# Patient Record
Sex: Female | Born: 1970 | Hispanic: Yes | Marital: Married | Smoking: Never smoker
Health system: Southern US, Community
[De-identification: ages and names within clinical notes are randomized; demographics above are authoritative.]

## PROBLEM LIST (undated history)

## (undated) DIAGNOSIS — D649 Anemia, unspecified: Secondary | ICD-10-CM

## (undated) DIAGNOSIS — N938 Other specified abnormal uterine and vaginal bleeding: Secondary | ICD-10-CM

## (undated) HISTORY — PX: NO PAST SURGERIES: SHX2092

## (undated) HISTORY — DX: Other specified abnormal uterine and vaginal bleeding: N93.8

---

## 1999-02-07 ENCOUNTER — Ambulatory Visit (HOSPITAL_COMMUNITY): Admission: RE | Admit: 1999-02-07 | Discharge: 1999-02-07 | Payer: Self-pay | Admitting: Obstetrics

## 1999-03-25 ENCOUNTER — Ambulatory Visit (HOSPITAL_COMMUNITY): Admission: RE | Admit: 1999-03-25 | Discharge: 1999-03-25 | Payer: Self-pay | Admitting: *Deleted

## 1999-08-22 ENCOUNTER — Inpatient Hospital Stay (HOSPITAL_COMMUNITY): Admission: AD | Admit: 1999-08-22 | Discharge: 1999-08-23 | Payer: Self-pay | Admitting: *Deleted

## 2007-06-08 ENCOUNTER — Ambulatory Visit: Payer: Self-pay | Admitting: *Deleted

## 2007-06-08 ENCOUNTER — Ambulatory Visit: Payer: Self-pay | Admitting: Family Medicine

## 2007-06-28 ENCOUNTER — Ambulatory Visit: Payer: Self-pay | Admitting: Internal Medicine

## 2007-06-28 ENCOUNTER — Encounter: Payer: Self-pay | Admitting: Family Medicine

## 2007-06-28 LAB — CONVERTED CEMR LAB
ALT: 20 units/L (ref 0–35)
AST: 19 units/L (ref 0–37)
Albumin: 4.4 g/dL (ref 3.5–5.2)
Alkaline Phosphatase: 60 units/L (ref 39–117)
BUN: 12 mg/dL (ref 6–23)
Basophils Absolute: 0 10*3/uL (ref 0.0–0.1)
Basophils Relative: 1 % (ref 0–1)
CO2: 23 meq/L (ref 19–32)
Calcium: 9.1 mg/dL (ref 8.4–10.5)
Chloride: 108 meq/L (ref 96–112)
Cholesterol: 148 mg/dL (ref 0–200)
Creatinine, Ser: 0.56 mg/dL (ref 0.40–1.20)
Eosinophils Absolute: 0.1 10*3/uL (ref 0.0–0.7)
Eosinophils Relative: 2 % (ref 0–5)
Glucose, Bld: 95 mg/dL (ref 70–99)
HCT: 41.4 % (ref 36.0–46.0)
HDL: 58 mg/dL (ref >39)
Hemoglobin: 13.1 g/dL (ref 12.0–15.0)
LDL Cholesterol: 82 mg/dL (ref 0–99)
Lymphocytes Relative: 38 % (ref 12–46)
Lymphs Abs: 2 10*3/uL (ref 0.7–4.0)
MCHC: 31.6 g/dL (ref 30.0–36.0)
MCV: 97.6 fL (ref 78.0–100.0)
Monocytes Absolute: 0.3 10*3/uL (ref 0.1–1.0)
Monocytes Relative: 6 % (ref 3–12)
Neutro Abs: 2.8 10*3/uL (ref 1.7–7.7)
Neutrophils Relative %: 53 % (ref 43–77)
Platelets: 253 10*3/uL (ref 150–400)
Potassium: 4.3 meq/L (ref 3.5–5.3)
RBC: 4.24 M/uL (ref 3.87–5.11)
RDW: 13.3 % (ref 11.5–15.5)
Sodium: 141 meq/L (ref 135–145)
TSH: 1.243 microintl units/mL (ref 0.350–5.50)
Total Bilirubin: 0.4 mg/dL (ref 0.3–1.2)
Total CHOL/HDL Ratio: 2.6
Total Protein: 7.5 g/dL (ref 6.0–8.3)
Triglycerides: 39 mg/dL (ref <150)
VLDL: 8 mg/dL (ref 0–40)
WBC: 5.3 10*3/uL (ref 4.0–10.5)

## 2007-07-13 ENCOUNTER — Ambulatory Visit: Payer: Self-pay | Admitting: Internal Medicine

## 2008-12-18 ENCOUNTER — Inpatient Hospital Stay (HOSPITAL_COMMUNITY): Admission: AD | Admit: 2008-12-18 | Discharge: 2008-12-20 | Payer: Self-pay | Admitting: Obstetrics

## 2010-03-03 ENCOUNTER — Encounter: Payer: Self-pay | Admitting: Obstetrics

## 2010-05-14 LAB — CBC
HCT: 28.8 % — ABNORMAL LOW (ref 36.0–46.0)
HCT: 38.9 % (ref 36.0–46.0)
Hemoglobin: 13.3 g/dL (ref 12.0–15.0)
Hemoglobin: 9.8 g/dL — ABNORMAL LOW (ref 12.0–15.0)
MCHC: 34 g/dL (ref 30.0–36.0)
MCHC: 34.1 g/dL (ref 30.0–36.0)
MCV: 96.7 fL (ref 78.0–100.0)
MCV: 98.1 fL (ref 78.0–100.0)
Platelets: 179 10*3/uL (ref 150–400)
Platelets: 219 10*3/uL (ref 150–400)
RBC: 2.93 MIL/uL — ABNORMAL LOW (ref 3.87–5.11)
RBC: 4.02 MIL/uL (ref 3.87–5.11)
RDW: 13.1 % (ref 11.5–15.5)
RDW: 13.3 % (ref 11.5–15.5)
WBC: 14.3 10*3/uL — ABNORMAL HIGH (ref 4.0–10.5)
WBC: 8.6 10*3/uL (ref 4.0–10.5)

## 2010-05-14 LAB — RPR: RPR Ser Ql: NONREACTIVE

## 2012-04-14 ENCOUNTER — Other Ambulatory Visit: Payer: Self-pay | Admitting: Obstetrics and Gynecology

## 2012-04-14 ENCOUNTER — Encounter: Payer: Self-pay | Admitting: Obstetrics & Gynecology

## 2012-04-14 DIAGNOSIS — Z1231 Encounter for screening mammogram for malignant neoplasm of breast: Secondary | ICD-10-CM

## 2012-04-19 ENCOUNTER — Encounter: Payer: Self-pay | Admitting: *Deleted

## 2012-04-27 ENCOUNTER — Encounter (HOSPITAL_COMMUNITY): Payer: Self-pay | Admitting: *Deleted

## 2012-05-03 ENCOUNTER — Encounter (HOSPITAL_COMMUNITY): Payer: Self-pay

## 2012-05-03 ENCOUNTER — Ambulatory Visit (HOSPITAL_COMMUNITY)
Admission: RE | Admit: 2012-05-03 | Discharge: 2012-05-03 | Disposition: A | Discharge status: Home / Self Care | Payer: Self-pay | Source: Ambulatory Visit | Attending: Obstetrics and Gynecology | Admitting: Obstetrics and Gynecology

## 2012-05-03 VITALS — BP 110/68 | Temp 98.7°F | Ht 65.5 in | Wt 156.0 lb

## 2012-05-03 DIAGNOSIS — Z1239 Encounter for other screening for malignant neoplasm of breast: Secondary | ICD-10-CM

## 2012-05-03 DIAGNOSIS — Z1231 Encounter for screening mammogram for malignant neoplasm of breast: Secondary | ICD-10-CM

## 2012-05-03 HISTORY — DX: Anemia, unspecified: D64.9

## 2012-05-03 NOTE — Patient Instructions (Signed)
Taught patient how to perform BSE and gave educational materials to take home. Patient did not need a Pap smear today due to last Pap smear was 03/14/2012. Let her know BCCCP will cover Pap smears every 3 years unless has a history of abnormal Pap smears. Let patient know will follow up with her within the next couple weeks with results by letter or phone. Patient verbalized understanding. Patient escorted to mammography for a screening.

## 2012-05-03 NOTE — Progress Notes (Signed)
No complaints today.  Pap Smear:  Pap smear not completed today. Last Pap smear was 03/14/2012 at the free cervical cancer screening at the [facility] and normal. Per patient no history of abnormal Pap smears. Pap smear result is scanned in EPIC under media.  Physical exam: Breasts Breasts symmetrical. No skin abnormalities bilateral breasts. No nipple retraction bilateral breasts. No nipple discharge bilateral breasts. No lymphadenopathy. No lumps palpated bilateral breasts. No complaints of pain or tenderness on exam. Patient escorted to mammography for a screening mammogram.        Pelvic/Bimanual No Pap smear completed today since last Pap smear was 03/14/2012. Pap smear not indicated per BCCCP guidelines.

## 2012-05-05 ENCOUNTER — Other Ambulatory Visit: Payer: Self-pay | Admitting: Obstetrics and Gynecology

## 2012-05-05 DIAGNOSIS — R928 Other abnormal and inconclusive findings on diagnostic imaging of breast: Secondary | ICD-10-CM

## 2012-05-09 ENCOUNTER — Other Ambulatory Visit (HOSPITAL_COMMUNITY)
Admission: RE | Admit: 2012-05-09 | Discharge: 2012-05-09 | Disposition: A | Discharge status: Home / Self Care | Payer: Self-pay | Source: Ambulatory Visit | Attending: Obstetrics & Gynecology | Admitting: Obstetrics & Gynecology

## 2012-05-09 ENCOUNTER — Ambulatory Visit (INDEPENDENT_AMBULATORY_CARE_PROVIDER_SITE_OTHER): Payer: Self-pay | Admitting: Obstetrics & Gynecology

## 2012-05-09 ENCOUNTER — Encounter: Payer: Self-pay | Admitting: Obstetrics & Gynecology

## 2012-05-09 VITALS — BP 104/66 | HR 67 | Temp 98.6°F | Wt 153.0 lb

## 2012-05-09 DIAGNOSIS — N920 Excessive and frequent menstruation with regular cycle: Secondary | ICD-10-CM | POA: Insufficient documentation

## 2012-05-09 LAB — POCT PREGNANCY, URINE: Preg Test, Ur: NEGATIVE

## 2012-05-09 LAB — TSH: TSH: 2.936 u[IU]/mL (ref 0.350–4.500)

## 2012-05-09 LAB — CBC
MCH: 23.6 pg — ABNORMAL LOW (ref 26.0–34.0)
Platelets: 271 10*3/uL (ref 150–400)
RBC: 3.48 MIL/uL — ABNORMAL LOW (ref 3.87–5.11)
RDW: 17.3 % — ABNORMAL HIGH (ref 11.5–15.5)
WBC: 3.6 10*3/uL — ABNORMAL LOW (ref 4.0–10.5)

## 2012-05-09 MED ORDER — NORGESTIMATE-ETH ESTRADIOL 0.25-35 MG-MCG PO TABS: 1.0000 | ORAL_TABLET | Freq: Every day | ORAL | Status: DC

## 2012-05-09 NOTE — Progress Notes (Signed)
Subjective:     Patient ID: Jill Meza, female   DOB: 14-Dec-1970, 42 y.o.   MRN: [id number]  HPI Pt is a 42yo [id number] who presents with c/o heavy bleeding.  She reports cycles that come q 28 days.  Reports bleeding between cycles and bleeding after intercourse.  She is sexually active with 1 partner.  She is married.  Her cycles are very heavy with clots and between cycles there is a mucous discharge that is brown.  She reports to being anemic and weak due to her loss of blood  Past Medical History  Diagnosis Date  . Anemia   No past surgical history on file. Current Outpatient Prescriptions on File Prior to Visit  Medication Sig Dispense Refill  . ferrous fumarate (HEMOCYTE - 106 MG FE) 325 (106 FE) MG TABS Take 1 tablet by mouth 2 (two) times daily.       No current facility-administered medications on file prior to visit.   History   Social History  . Marital Status: Single    Spouse Name: N/A    Number of Children: N/A  . Years of Education: N/A   Occupational History  . Not on file.   Social History Main Topics  . Smoking status: Never Smoker   . Smokeless tobacco: Never Used  . Alcohol Use: No  . Drug Use: No  . Sexually Active: Yes    Birth Control/ Protection: Other-see comments     Comment: vasecotomy   Other Topics Concern  . Not on file   Social History Narrative  . No narrative on file           Review of Systems     Objective:   Physical Exam BP 104/66  Pulse 67  Temp(Src) 98.6 F (37 C) (Oral)  Wt 153 lb (69.4 kg)  BMI 25.06 kg/m2  LMP 04/25/2012   The indications for endometrial biopsy were reviewed.   Risks of the biopsy including cramping, bleeding, infection, uterine perforation, inadequate specimen and need for additional procedures  were discussed. The patient states she understands and agrees to undergo procedure today. Consent was signed. Time out was performed. Urine HCG was negative. A sterile speculum was placed in the  patient's vagina and the cervix was prepped with Betadine. A single-toothed tenaculum was placed on the anterior lip of the cervix to stabilize it. The 3 mm pipelle was introduced into the endometrial cavity without difficulty to a depth of 13cm, and a moderate amount of tissue was obtained and sent to pathology. The instruments were removed from the patient's vagina. Minimal bleeding from the cervix was noted. The patient tolerated the procedure well.     Assessment:     Menorrhagia- need to eval etiology     Plan:     Labs: cbc; TSH Routine post-procedure instructions were given to the patient. The patient will follow up to review the results and for further management.   Sprintec 1 po q day F/u 2 weeks Pelvic sono  Spanish translator used for the history and physical exam

## 2012-05-09 NOTE — Patient Instructions (Addendum)
Menorragia (Menorrhagia) La hemorragia uterina (sangrado del tero) disfuncional es diferente del perodo menstrual normal. Cuando los perodos son irregulares o hay ms hemorragia que lo habitual en usted, se denomina menorragia. La causa puede ser un desequilibrio hormonal, fsico o metablico u otros problemas. Es necesario realizar un examen para que el profesional pueda tratar controlar las causas que son tratables. Si el problema contina, ser necesario realizar una dilatacin y curetaje. Este procedimiento consiste en dilatar el cuello del tero (la apertura del tero o matriz), es decir, se lo estira para agrandarlo, y se raspa la superficie que tapiza el interior del tero. Un especialista (patlogo) examina en el microscopio el tejido que se retira para asegurarse que no haya nada preocupante que requiera un examen ms exhaustivo. INSTRUCCIONES PARA EL CUIDADO DOMICILIARIO  Si el profesional que la asiste le prescribe medicamentos, tmelos tal como se le indic. No cambie ni reemplace medicamentos sin consultarlo con el profesional.  Las hemorragias de larga duracin pueden tener como consecuencia un dficit de hierro. El profesional que la asiste podr prescribirle comprimidos de hierro. Esto ayuda a reponer el hierro que el organismo pierde luego de una hemorragia abundante. Tome los medicamentos tal como se le indic. El hierro puede causarle estreimiento. Si esto es un problema, aumente el consumo de salvado, frutas y fibra.  No tome aspirina o medicamentos que la contengan desde una semana antes del perodo menstrual ni durante el mismo. La aspirina puede hacer que la hemorragia empeore.  Si necesita cambiar el apsito o el tampn mas de una vez cada 2 horas, permanezca en cama y descanse todo lo posible hasta que la hemorragia se detenga.  Consuma alimentos balanceados. Coma alimentos ricos en hierro. Por ejemplo, verduras de hoja verde, carne, hgado, huevos y panes y cereales de  grano integral. No trate de perder peso hasta que la hemorragia anormal se detenga y los niveles de hierro en la sangre vuelvan a la normalidad. SOLICITE ATENCIN MDICA SI:  Debe cambiar el apsito o el tampn ms de una vez cada hora.  Si tiene nuseas (ganas de vomitar), vmitos, mareos o diarrea mientras toma los medicamentos.  Tiene algn problema que pueda relacionarse con el medicamento que est tomando. SOLICITE ATENCIN MDICA DE INMEDIATO SI:  Tiene fiebre.  Siente escalofros.  Sufre una hemorragia intensa o comienza a eliminar cogulos de sangre.  Se siente mareado o sufre un desmayo. EST SEGURO QUE:   Comprende las instrucciones para el alta mdica.  Controlar su enfermedad.  Solicitar atencin mdica de inmediato segn las indicaciones. Document Released: 11/05/2004 Document Revised: 04/20/2011 Exit[employer] Patient Information 2013 [employer].  

## 2012-05-12 ENCOUNTER — Ambulatory Visit (HOSPITAL_COMMUNITY)
Admission: RE | Admit: 2012-05-12 | Discharge: 2012-05-12 | Disposition: A | Discharge status: Home / Self Care | Payer: Self-pay | Source: Ambulatory Visit | Attending: Obstetrics & Gynecology | Admitting: Obstetrics & Gynecology

## 2012-05-12 ENCOUNTER — Encounter: Payer: Self-pay | Admitting: *Deleted

## 2012-05-12 DIAGNOSIS — N938 Other specified abnormal uterine and vaginal bleeding: Secondary | ICD-10-CM | POA: Insufficient documentation

## 2012-05-12 DIAGNOSIS — N949 Unspecified condition associated with female genital organs and menstrual cycle: Secondary | ICD-10-CM | POA: Insufficient documentation

## 2012-05-12 DIAGNOSIS — N920 Excessive and frequent menstruation with regular cycle: Secondary | ICD-10-CM

## 2012-05-12 DIAGNOSIS — D259 Leiomyoma of uterus, unspecified: Secondary | ICD-10-CM | POA: Insufficient documentation

## 2012-05-17 ENCOUNTER — Ambulatory Visit
Admission: RE | Admit: 2012-05-17 | Discharge: 2012-05-17 | Disposition: A | Discharge status: Home / Self Care | Payer: No Typology Code available for payment source | Source: Ambulatory Visit | Attending: Obstetrics and Gynecology | Admitting: Obstetrics and Gynecology

## 2012-05-17 DIAGNOSIS — R928 Other abnormal and inconclusive findings on diagnostic imaging of breast: Secondary | ICD-10-CM

## 2012-05-25 ENCOUNTER — Ambulatory Visit: Payer: Self-pay | Admitting: Obstetrics & Gynecology

## 2012-06-13 ENCOUNTER — Encounter: Payer: Self-pay | Admitting: Obstetrics & Gynecology

## 2012-06-13 ENCOUNTER — Ambulatory Visit (INDEPENDENT_AMBULATORY_CARE_PROVIDER_SITE_OTHER): Payer: Self-pay | Admitting: Obstetrics & Gynecology

## 2012-06-13 VITALS — BP 111/68 | HR 72 | Temp 99.0°F | Ht 64.0 in | Wt 154.8 lb

## 2012-06-13 DIAGNOSIS — N920 Excessive and frequent menstruation with regular cycle: Secondary | ICD-10-CM

## 2012-06-13 DIAGNOSIS — D500 Iron deficiency anemia secondary to blood loss (chronic): Secondary | ICD-10-CM

## 2012-06-13 MED ORDER — NORGESTIMATE-ETH ESTRADIOL 0.25-35 MG-MCG PO TABS: 1.0000 | ORAL_TABLET | Freq: Every day | ORAL | Status: DC

## 2012-06-13 MED ORDER — INTEGRA F 125-1 MG PO CAPS: 1.0000 | ORAL_CAPSULE | Freq: Every day | ORAL | Status: DC

## 2012-06-13 NOTE — Progress Notes (Signed)
Patient states she has not taken any of the birth control pills- does not know why not

## 2012-06-13 NOTE — Patient Instructions (Addendum)
Fibromas (Fibroids) Los fibromas son bultos (tumores) que pueden Conservation officer, nature del cuerpo de Nurse, mental health. Estos tumores no son cancerosos. Pueden variar en tamao, peso y lugar en el que crecen. CUIDADOS EN EL HOGAR  No tome aspirina.  Anote el nmero de apsitos o tampones que [place] durante el perodo. Infrmelo a su mdico. Esto puede ayudar a determinar el mejor tratamiento para usted. SOLICITE AYUDA DE INMEDIATO SI:  Siente dolor en la zona inferior del vientre (abdomen) y no se alivia con analgsicos.  Tiene clicos que no se calman con medicamentos  Aumenta el sangrado entre perodos o durante el mismo.  Sufre mareos o se desvanece (se desmaya).  El dolor en el vientre [place]. ASEGRESE DE QUE:  Comprende estas instrucciones.  Controlar su enfermedad.  Solicitar ayuda de inmediato si no mejora o empeora. Document Released: 05/13/2010 Document Revised: 04/20/2011 [facility] Patient Information 2013 [place], [place]. Fibroma uterino  (Uterine Fibroid)  Un fibroma uterino es Facilities manager (tumor) dentro del tero de Amanda. Este tipo de tumor no es Insurance risk surveyor y no se extiende fuera del tero. Una mujer puede tener uno o varios fibromas y algunos pueden ser bastante grandes. Un fibroma puede variar en tamao, peso y Immunologist en que se desarrolla dentro del tero. La mayora de los fibromas no necesitan tratamiento mdico, pero algunos pueden causar dolor o sangrado abundante durante los perodos y [place]. CAUSAS  Un fibroma es el resultado del desarrollo continuo de una nica clula uterina que sigue creciendo (no regulada) que es diferente al resto de las clulas del cuerpo humano. La mayora de las clulas tiene un mecanismo de control que evita que se reproduzcan de Psychologist, occupational.  SNTOMAS   Sangrado.  Dolor y sensacin de presin en la pelvis.  Problemas en la vejiga debido al tamao del fibroma.  Infertilidad y abortos espontneos, segn el  tamao y la ubicacin del fibroma. DIAGNSTICO  El diagnstico se hace por examen fsico. El mdico puede palpar los tumores abultados al realizar el examen de la pelvis. Una ecografa puede brindar informacin adicional importante acerca del tamao, la ubicacin y el nmero de tumores. Es raro que sea Passenger transport manager otras pruebas, como tomografa computada o Health visitor.  TRATAMIENTO   Su mdico puede considerar que es conveniente esperar y Pharmacologist. Esto incluye el control del fibroma por parte del mdico para observar si crece o disminuye su tamao.   Podr recomendarle un tratamiento hormonal o el uso de un dispositivo intrauterino (DIU).   En algunos casos es necesaria la ciruga para extirpar el fibroma (miomectoma) o el tero (histerectoma). Esto depender de su situacin. Cuando una mujer desea quedar embarazada y los fibromas interfieren en su fertilidad, el mdico puede recomendar la extirpacin del fibroma.  INSTRUCCIONES PARA EL CUIDADO EN EL HOGAR  Los cuidados en el hogar dependen del tratamiento que haya recibido. En general:   Concurra puntualmente a las citas de control con el mdico.   Slo tome los medicamentos que le indic su mdico. No tome aspirina. Puede ocasionar hemorragias.   Si tiene perodos muy abundantes y debe cambiar un tampn o una toalla higinica en media hora o menos, comunquese con su mdico inmediatamente. Si sus perodos son molestos pero no tan abundantes, acustese con los pies ligeramente elevados por encima del nivel del corazn. Coloque compresas fras en la zona inferior del abdomen.   Si sus perodos son muy abundantes  , anote el nmero de compresas o  tampones que [place] cada mes. Lleve esta informacin cuando visite a su mdico.   Consulte al mdico si debe tomar pldoras de hierro.   Incluya vegetales en su dieta.   Si le recetaron un tratamiento hormonal, tome los medicamentos hormonales como le indicaron.    Si le indicaron la ciruga, pdale informacin especfica a su mdico.  SOLICITE ATENCIN MDICA DE INMEDIATO SI:   Siente dolor o clicos en la pelvis y no puede controlarlos con los medicamentos.   El dolor en la pelvis aumenta de manera repentina.   Aumenta el sangrado entre los perodos o [employer].   Se siente mareado o tiene episodios de [place].  ASEGRESE DE QUE:   Comprende estas instrucciones.  Controlar su enfermedad.  Solicitar ayuda de inmediato si no mejora o si empeora. Document Released: 01/26/2005 Document Revised: 04/20/2011 [facility] Patient Information 2013 [place], [place]. Miomectoma (Myomectomy) El mioma es un tumor no canceroso que se desarrolla en el tejido fibroso. La miomectoma es la remocin de un fibroma sin extirpar otro rgano como el tero o el ovario. Estos tumores varan en tamao desde un guisante hasta un pomelo. Rara vez son cancerosos. El tratamiento slo se realiza cuando crece o cuando causa sntomas como: dolor, presin, hemorragia y Engineer, mining en las [employer]. INFORME AL PROFESIONAL QUE LO ASISTE SOBRE LOS SIGUIENTES PUNTOS:  Si tiene alergias, especialmente a algunos medicamentos.  Si se resfra o tiene alguna infeccin.  Medicamentos que toma, incluyendo hierbas, gotas oftlmicas, medicamentos de [place] y cremas  Si [place] corticoides, an en forma de cremas.  Problemas anteriores debido a anestsicos o a Community education officer.  Antecedentes de hemorragias o problemas de coagulacin  Otros problemas mdicos (diabetes, insuficiencia cardaca, pulmonar o renal).  Cirugas previas. RIESGOS Y COMPLICACIONES  Sangrado excesivo  Infeccin.  Lesin en los rganos circundantes.  Cogulos sanguneos en las piernas, el trax y el cerebro.  Desarrollo de tejido cicatrizal (adherencias) en otros rganos y en la pelvis.  Muerte durante o despus de la [place]. ANTES DE LA CIRUGA  Siga las  indicaciones del mdico relacionadas con la ciruga y la preparacin para la misma.  Evite tomar aspirina o anticoagulantes, o segn las indicaciones del mdico.  NO coma ni beba nada despus de la medianoche anterior a la ciruga o segn lo que le indiquen.  NO fume durante las [address] previas a la [place].  NO beba alcohol el da anterior a la [place].Raechel Chute al hospital el mismo da, trate de estar una hora antes de la hora programada en la oficina de admisiones.  Haga que alguna persona la lleve [employer] hospital.  Pdale a algn familiar o amigo que se quede con usted y la ayude cuando regrese a su casa. PROCEDIMIENTO Hay diferentes modos de realizar [employer].  Miomectoma histeroscpica  Se inserta un tubo dentro del tero. Con el tubo se extirpa el tumor fibroide. This is used when the fibroid is inside the cavity of the uterus. ste mtodo se utiliza cuando el fibroma se encuentra dentro de la cavidad del tero.  Miomectoma laparoscpica  Se utiliza un tubo largo con iluminacin que se inserta a travs de 2  3 incisiones pequeas para observar los rganos de la pelvis. Se utiliza para extirpar el fibroma pediculado y en algunos casos el fibroma subseroso.  La miomectoma a travs de una incisin abdominal: El fibroma se extirpa a travs de una incisin abdominal, cuando no puede extirparse por  medio de una histeroscopa o laparoscopa. LUEGO DE LA CIRUGA  Si le practicaron una miomectoma laparoscpica o histeroscpica, podr volver a su casa el mismo da o quedarse una noche en el hospital.  Si le realizaron una miomectoma abdominal, deber Engineer, maintenance hospital [employer].  Le retirarn el catter y la va intravenosa en 1  2 das.  Si permanece en el hospital, el profesional le indicar analgsicos y pldoras para dormir, si es necesario.  En caso de necesitarlo, le indicarn antibiticos.  Antes de recibir el alta, le darn indicaciones por  escrito y medicamentos. Document Released: 07/15/2007 Document Revised: 04/20/2011 [facility] Patient Information 2013 [place], [place]. Informacin sobre la histerectoma  (Hysterectomy Information)  Neomia Dear histerectoma es un procedimiento en el que se extirpa quirrgicamente el tero. Ya no tendr perodos menstruales ni quedar embarazada. Tambin durante esta ciruga podrn extirparle las trompas y los ovarios (salpingo-ooforectoma bilateral).  RAZONES PARA REALIZAR UNA HISTERECTOMA   Sangrado persistente, anormal.  Infeccin o dolor plvico de larga duracin (crnico).  El revestimiento del tero (endometrio) comienza a desarrollarse fuera del tero (endometriosis).  El endometrio comienza a desarrollarse en el msculo del tero (adenomiosis).  El tero desciende hacia la vagina (prolapso de los rganos plvicos).  Fibromas uterinos sintomticos.  Clulas precancerosas.  Cncer cervical o cncer uterino. TIPOS DE HISTERECTOMA   Histerectoma supracervical. Este tipo de ciruga elimina la parte superior del tero, pero no el cuello del tero.  Histerectoma total. Se extirpa el tero y el cuello uterino.  Histerectoma radical. Se extrae el tero, el cuello uterino y el tejido fibroso que sostiene el tero en su lugar en la pelvis (parametrio). FORMAS EN QUE SE PUEDE REALIZAR UNA HISTERECTOMA   Histerectoma abdominal. Se realiza gran corte quirrgico (incisin) en el abdomen. Se extirpa el tero a travs de esta incisin.  Histerectoma vaginal. Se hace una incisin en la vagina. Se extirpa el tero a travs de esta incisin. No hay incisiones abdominales.  Histerectoma laparoscpica convencional. Se inserta un tubo delgado que emite luz y que posee una cmara (laparoscopio) en 3 o 4 incisiones pequeas en el abdomen. El tero se corta en trozos pequeos. Las piezas pequeas se eliminan a travs de las incisiones, o se retiran a travs de la vagina.  Histerectoma  laparoscpica vaginal asistida. Se hacen tres o cuatro incisiones pequeas en el abdomen. Parte de la ciruga se realiza por va laparoscpica y parte por va vaginal. El tero se extirpa a travs de la vagina.  Histerectoma laparoscpica asistida por robot. Se inserta un laparoscopio en 3 o 4 incisiones pequeas en el abdomen. Se utiliza un dispositivo controlado por computadora para que el cirujano vea una imagen en 3D. Esto permite movimientos ms precisos de los instrumentos quirrgicos. El tero se corta en trozos pequeos y se retira a travs de las incisiones o se elimina a travs de la vagina. RIESGOS DE LA HISTERECTOMA   Hemorragia y riesgos de necesitar una transfusin sangunea. Informe a su mdico si no quiere recibir productos de Risk manager.  Cogulos de [employer] piernas o los pulmones.  Infecciones.  Lesin en los rganos circundantes.  Problemas o efectos secundarios por la anestesia.  Conversin a una histerectoma abdominal. QU ESPERAR DESPUES DE UNA HISTERECTOMA   Le administrarn medicamentos para calmar el dolor.  Necesitar que alguien permanezca con usted durante los primeros 3 a 5 das despus de regresar a Higher education careers adviser.  Tendr que hacer un control con su cirujano en 2 a  4 semanas despus de la ciruga para evaluar su progreso.  Podr tener sntomas tempranos de menopausia, como sofocos, sudores nocturnos e insomnio.  Si le han realizado una histerectoma por un problema que no era cncer u otra enfermedad que podra causar cncer, ya no necesitar un Papanicolaou. Sin embargo, si ya no necesita hacerse un Papanicolau, es una buena idea hacerse un examen regularmente para asegurarse de que no hay otros problemas. Document Released: 01/26/2005 Document Revised: 04/20/2011 [facility] Patient Information 2013 [place], [place].

## 2012-06-13 NOTE — Progress Notes (Signed)
Subjective:     Patient ID: Jill Meza, female   DOB: [birth year], 42 y.o.   MRN: [id number]  HPI Pt with h/o menorrhagia due to uterine fibroids.  Had endo bx at her last visit.  Here for results.  No change in her bleeding.  She did not take the meds prescribed.  C/o chroinc fatigue and feeling tired all the time.     Review of Systems     Objective:   Physical Exam  CBC    Component Value Date/Time   WBC 3.6* 05/09/2012 1633   RBC 3.48* 05/09/2012 1633   HGB 8.2* 05/09/2012 1633   HCT 27.2* 05/09/2012 1633   PLT 271 05/09/2012 1633   MCV 78.2 05/09/2012 1633   MCH 23.6* 05/09/2012 1633   MCHC 30.1 05/09/2012 1633   RDW 17.3* 05/09/2012 1633   LYMPHSABS 2.0 06/28/2007 2050   MONOABS 0.3 06/28/2007 2050   EOSABS 0.1 06/28/2007 2050   BASOSABS 0.0 06/28/2007 2050   05/09/2012 Diagnosis Endometrium, biopsy - PROLIFERATIVE-TYPE ENDOMETRIUM WITH BREAKDOWN. - THERE IS NO EVIDENCE OF HYPERPLASIA OR MALIGNANCY. - SEE COMMENT.  05/12/2012 Clinical Data: Dysfunctional uterine bleeding with heavy frequent  menses. LMP 04/25/2012  TRANSABDOMINAL AND TRANSVAGINAL ULTRASOUND OF PELVIS  Technique: Both transabdominal and transvaginal ultrasound  examinations of the pelvis were performed. Transabdominal technique  was performed for global imaging of the pelvis including uterus,  ovaries, adnexal regions, and pelvic cul-de-sac.  It was necessary to proceed with endovaginal exam following the  transabdominal exam to visualize the myometrium, endometrium and  adnexa.  Comparison: None  Findings:  Uterus: Is anteverted and anteflexed and demonstrates a sagittal  length of 13.3 cm, depth of 6.8 cm and width of 7.5 cm. There is a  left anterolateral lower uterine segment fibroid identified  measuring 6.7 x 4.6 x 5.8 cm which appears to have a large  submucosal component. On cineloop evaluation there is suggestion  that a portion may extend into the endocervical canal. The degree   of submucosal involvement is difficult to accurately assess on this  exam.  Endometrium: Is incompletely evaluated due to the presence of the  large fibroid with submucosal involvement  Right ovary: Measures 3.6 x 1.4 x 1.8 cm and has a normal  appearance  Left ovary: Is best assessed transabdominally and has a normal  transabdominal appearance measuring 3.2 x 2.2 x 2.7 cm  Other findings: No pelvic fluid or separate adnexal masses are  identified.  IMPRESSION:  Large focal fibroid with suggestion of significant submucosal  component noted on cine loop evaluation. An accurate assessment  degree of submucosal involvement was not possible with this study  and if further assessment is desired, pelvic MRI would be  recommended.  Normal ovaries.       Assessment:     Menorrhagia thought due to uterine fibroids.  She did not take meds. D/w pt the risks of anemia and the treatment options for fibroids including hyst and myomectomy.  Pt will take OCP's for now.        Plan:     Sprintec 1 po q day F/u 3 months Ferrous Fumarate  Spanish interpreter used to speak to pt

## 2012-06-28 ENCOUNTER — Encounter (HOSPITAL_COMMUNITY): Payer: Self-pay | Admitting: *Deleted

## 2012-06-28 ENCOUNTER — Inpatient Hospital Stay (HOSPITAL_COMMUNITY)
Admission: AD | Admit: 2012-06-28 | Discharge: 2012-06-28 | Disposition: A | Discharge status: Home / Self Care | Payer: Self-pay | Source: Ambulatory Visit | Attending: Obstetrics and Gynecology | Admitting: Obstetrics and Gynecology

## 2012-06-28 DIAGNOSIS — N949 Unspecified condition associated with female genital organs and menstrual cycle: Secondary | ICD-10-CM | POA: Insufficient documentation

## 2012-06-28 DIAGNOSIS — R109 Unspecified abdominal pain: Secondary | ICD-10-CM | POA: Insufficient documentation

## 2012-06-28 DIAGNOSIS — N920 Excessive and frequent menstruation with regular cycle: Secondary | ICD-10-CM | POA: Insufficient documentation

## 2012-06-28 DIAGNOSIS — D259 Leiomyoma of uterus, unspecified: Secondary | ICD-10-CM | POA: Insufficient documentation

## 2012-06-28 DIAGNOSIS — N938 Other specified abnormal uterine and vaginal bleeding: Secondary | ICD-10-CM | POA: Insufficient documentation

## 2012-06-28 LAB — CBC
Hemoglobin: 10.6 g/dL — ABNORMAL LOW (ref 12.0–15.0)
MCHC: 32.1 g/dL (ref 30.0–36.0)
RBC: 3.7 MIL/uL — ABNORMAL LOW (ref 3.87–5.11)

## 2012-06-28 MED ORDER — NORGESTIMATE-ETH ESTRADIOL 0.25-35 MG-MCG PO TABS: 2.0000 | ORAL_TABLET | Freq: Every day | ORAL | Status: DC

## 2012-06-28 NOTE — MAU Provider Note (Signed)
History     CSN: [id number]  Arrival date and time: 06/28/12 1223   None     Chief Complaint  Patient presents with  . Menorrhagia  . Abdominal Pain   HPI This is a 42 y.o. female who presents with c/o continued heavy bleeding with clots. Has been followed in clinic by Dr Erin Fulling and was offered various treatment for known fibroid and advised to continue OCPs (which she had not actually ever started prior to last visit).    Clinic Summary (06/13/12): Assessment:   Menorrhagia thought due to uterine fibroids. She did not take meds. D/w pt the risks of anemia and the treatment options for fibroids including hyst and myomectomy. Pt will take OCP's for now.  Plan:   Sprintec 1 po q day  F/u 3 months  Ferrous Fumarate  Spanish interpreter used to speak to pt    RN Note: Pt states she has been heavy bleeding for a few months now.Pt states the blood clots are scaring her because they have never been this large. Pt states she has been having heavy bleeding for 3 days.  Revision History...     OB History   Grav Para Term Preterm Abortions TAB SAB Ect Mult Living   4 4 4       4       Past Medical History  Diagnosis Date  . Anemia     History reviewed. No pertinent past surgical history.  Family History  Problem Relation Age of Onset  . Hypertension Mother   . Heart attack Father     History  Substance Use Topics  . Smoking status: Never Smoker   . Smokeless tobacco: Never Used  . Alcohol Use: No    Allergies: No Known Allergies  Prescriptions prior to admission  Medication Sig Dispense Refill  . ferrous fumarate (HEMOCYTE - 106 MG FE) 325 (106 FE) MG TABS Take 1 tablet by mouth 2 (two) times daily.      . Multiple Vitamins-Minerals (MULTIVITAMIN WITH MINERALS) tablet Take 1 tablet by mouth daily.      . norgestimate-ethinyl estradiol (ORTHO-CYCLEN,SPRINTEC,PREVIFEM) 0.25-35 MG-MCG tablet Take 1 tablet by mouth daily.  1 Package  11    Review of Systems   Constitutional: Negative for fever, chills and malaise/fatigue.  Gastrointestinal: Positive for abdominal pain (mild cramping). Negative for nausea, vomiting, diarrhea and constipation.  Neurological: Positive for weakness. Negative for dizziness and headaches.  Psychiatric/Behavioral: Negative for depression.   Physical Exam   Blood pressure 123/62, pulse 66, temperature 97.8 F (36.6 C), resp. rate 18, height 5\' 5"  (1.651 m), weight 70.58 kg (155 lb 9.6 oz), last menstrual period 06/23/2012, SpO2 100.00%.  Physical Exam  Constitutional: She is oriented to person, place, and time. She appears well-developed and well-nourished. No distress.  HENT:  Head: Normocephalic.  Cardiovascular: Normal rate.   Respiratory: Effort normal.  GI: Soft. She exhibits no distension. There is no tenderness. There is no rebound and no guarding.  Genitourinary: Uterus normal. Vaginal discharge (moderate dark blood) found.  Uterus enlarged   Musculoskeletal: Normal range of motion.  Neurological: She is alert and oriented to person, place, and time.  Skin: Skin is warm and dry.  Psychiatric: She has a normal mood and affect.    MAU Course  Procedures  MDM Results for orders placed during the hospital encounter of 06/28/12 (from the past 24 hour(s))  CBC     Status: Abnormal   Collection Time    06/28/12  3:01 PM      Result Value Range   WBC 5.2  4.0 - 10.5 K/uL   RBC 3.70 (*) 3.87 - 5.11 MIL/uL   Hemoglobin 10.6 (*) 12.0 - 15.0 g/dL   HCT 16.1 (*) 09.6 - 04.5 %   MCV 89.2  78.0 - 100.0 fL   MCH 28.6  26.0 - 34.0 pg   MCHC 32.1  30.0 - 36.0 g/dL   RDW 40.9 (*) 81.1 - 91.4 %   Platelets 263  150 - 400 K/uL   Last Hgb was 8  Assessment and Plan  A:  Dysfunctional Uterine Bleeding      Improved Hemoglobin  P: Discussed options for treatment      Pt expresses desire to proceed with surgery for fibroid. Does not want more children. Is OK with either ablation, myomectomy or hysterectomy  "whichever is best"     Will try to get her back to clinic to discuss with surgeon.   [facility] 06/28/2012, 3:57 PM

## 2012-06-28 NOTE — MAU Note (Signed)
Having pain lower back and abdomen for 3 days. Also having vaginal bleeding for 3 days with clots

## 2012-06-28 NOTE — MAU Note (Addendum)
Pt states she has been heavy bleeding for a few months now.Pt states the blood clots are scaring her because they have never been this large. Pt states she has been having heavy bleeding for 3 days.

## 2012-06-30 NOTE — MAU Provider Note (Signed)
Chart reviewed and agree with management and plan.  

## 2012-07-07 ENCOUNTER — Encounter: Payer: Self-pay | Admitting: Obstetrics & Gynecology

## 2012-07-07 ENCOUNTER — Ambulatory Visit (INDEPENDENT_AMBULATORY_CARE_PROVIDER_SITE_OTHER): Payer: Self-pay | Admitting: Obstetrics & Gynecology

## 2012-07-07 VITALS — BP 102/54 | HR 99 | Temp 99.6°F | Ht 63.0 in | Wt 154.0 lb

## 2012-07-07 DIAGNOSIS — D219 Benign neoplasm of connective and other soft tissue, unspecified: Secondary | ICD-10-CM

## 2012-07-07 DIAGNOSIS — N939 Abnormal uterine and vaginal bleeding, unspecified: Secondary | ICD-10-CM

## 2012-07-07 DIAGNOSIS — D260 Other benign neoplasm of cervix uteri: Secondary | ICD-10-CM

## 2012-07-07 DIAGNOSIS — N898 Other specified noninflammatory disorders of vagina: Secondary | ICD-10-CM

## 2012-07-07 DIAGNOSIS — D259 Leiomyoma of uterus, unspecified: Secondary | ICD-10-CM

## 2012-07-07 NOTE — Progress Notes (Signed)
Subjective:     Patient ID: Jill Meza, female   DOB: 1970-06-07, 42 y.o.   MRN: [id number]  HPI Pt is a 42yo [id number] who reports that she has continued to bleed despite OCP's.  She has bled a total of 14 days and it has not improrved.  She has no pain associated with the bleeding.  She is extremely frustrated and wants definitive treatment.   Past Medical History  Diagnosis Date  . Anemia   . DUB (dysfunctional uterine bleeding)   History reviewed. No pertinent past surgical history. Current Outpatient Prescriptions on File Prior to Visit  Medication Sig Dispense Refill  . Fe Fum-FePoly-FA-Vit C-Vit B3 (INTEGRA F PO) Take 1 capsule by mouth daily.      . ferrous sulfate 325 (65 FE) MG tablet Take 325 mg by mouth 2 (two) times daily.      . Multiple Vitamins-Minerals (MULTIVITAMIN WITH MINERALS) tablet Take 1 tablet by mouth daily.      . norgestimate-ethinyl estradiol (ORTHO-CYCLEN,SPRINTEC,PREVIFEM) 0.25-35 MG-MCG tablet Take 2 tablets by mouth daily.  1 Package  11   No current facility-administered medications on file prior to visit.   No Known Allergies History   Social History  . Marital Status: Married    Spouse Name: N/A    Number of Children: N/A  . Years of Education: N/A   Occupational History  . Not on file.   Social History Main Topics  . Smoking status: Never Smoker   . Smokeless tobacco: Never Used  . Alcohol Use: No  . Drug Use: No  . Sexually Active: Yes    Birth Control/ Protection: Other-see comments     Comment: vasecotomy   Other Topics Concern  . Not on file   Social History Narrative  . No narrative on file        Review of Systems     Objective:   Physical Exam BP 102/54  Pulse 99  Temp(Src) 99.6 F (37.6 C)  Ht 5\' 3"  (1.6 m)  Wt 154 lb (69.854 kg)  BMI 27.29 kg/m2  LMP 06/23/2012 Pt in NAD Lungs: CTA CV: RRR Abd: soft, NT, ND no scars noted GU: EGBUS: no lesions Vagina: positive blood in vault Cervix:  large pedunculated lesion protruding from cervix width ~4cm.  Lesion most consistent with a pedunculated fibroid which is consistent with the sono reports; it is well circumscribed and is on a long stalk.  Too large for removal in the office. O no mucopurulent d/c Uterus: enlarged ~12-14 weeks sizede Adnexa: no masses;non tender     03/14/2012 PAP- wnl 05/10/2012 Endo bx Diagnosis Endometrium, biopsy - PROLIFERATIVE-TYPE ENDOMETRIUM WITH BREAKDOWN. - THERE IS NO EVIDENCE OF HYPERPLASIA OR MALIGNANCY. - SEE COMMENT. 05/12/2012 Findings:  Uterus: Is anteverted and anteflexed and demonstrates a sagittal  length of 13.3 cm, depth of 6.8 cm and width of 7.5 cm. There is a  left anterolateral lower uterine segment fibroid identified  measuring 6.7 x 4.6 x 5.8 cm which appears to have a large  submucosal component. On cineloop evaluation there is suggestion  that a portion may extend into the endocervical canal. The degree  of submucosal involvement is difficult to accurately assess on this  exam.  Endometrium: Is incompletely evaluated due to the presence of the  large fibroid with submucosal involvement  Right ovary: Measures 3.6 x 1.4 x 1.8 cm and has a normal  appearance  Left ovary: Is best assessed transabdominally and has a normal  transabdominal  appearance measuring 3.2 x 2.2 x 2.7 cm  Other findings: No pelvic fluid or separate adnexal masses are  identified.  IMPRESSION:  Large focal fibroid with suggestion of significant submucosal  component noted on cine loop evaluation. An accurate assessment  degree of submucosal involvement was not possible with this study  and if further assessment is desired, pelvic MRI would be  recommended.  Normal ovaries.     Assessment:     Bleeding from pedunculated fibroids     Plan:     Patient desires surgical management with laparoscopic assisted vaginal hysterectomy with bilateral salpingectomy.  The risks of surgery were discussed in  detail with the patient including but not limited to: bleeding which may require transfusion or reoperation; infection which may require prolonged hospitalization or re-hospitalization and antibiotic therapy; injury to bowel, bladder, ureters and major vessels or other surrounding organs; need for additional procedures including laparotomy; thromboembolic phenomenon, incisional problems and other postoperative or anesthesia complications.  Patient was told that the likelihood that her condition and symptoms will be treated effectively with this surgical management was very high; the postoperative expectations were also discussed in detail. The patient also understands the alternative treatment options which were discussed in full. All questions were answered.  She was told that she will be contacted by our surgical scheduler regarding the details of her surgery; routine preoperative instructions of having nothing to eat or drink after midnight on the day prior to surgery and also coming to the hospital 1 1/2 hours prior to her time of surgery were also emphasized.  She was told she may be called for a preoperative appointment about a week prior to surgery and will be given further preoperative instructions at that visit. Printed patient education handouts in Spanish were given to the patient about the procedure to review at home.  Her surgery date is June 5th at 0930.

## 2012-07-07 NOTE — Progress Notes (Signed)
Here for surgical consult- states in bed until appointment today because feels tired, still bleeding.

## 2012-07-07 NOTE — Patient Instructions (Addendum)
Fibroma uterino  (Uterine Fibroid)  Un fibroma uterino es Facilities manager (tumor) dentro del tero de [place]. Este tipo de tumor no es Insurance risk surveyor y no se extiende fuera del tero. Una mujer puede tener uno o varios fibromas y algunos pueden ser bastante grandes. Un fibroma puede variar en tamao, peso y Immunologist en que se desarrolla dentro del tero. La mayora de los fibromas no necesitan tratamiento mdico, pero algunos pueden causar dolor o sangrado abundante durante los perodos y [place]. CAUSAS  Un fibroma es el resultado del desarrollo continuo de una nica clula uterina que sigue creciendo (no regulada) que es diferente al resto de las clulas del cuerpo humano. La mayora de las clulas tiene un mecanismo de control que evita que se reproduzcan de Psychologist, occupational.  SNTOMAS   Sangrado.  Dolor y sensacin de presin en la pelvis.  Problemas en la vejiga debido al tamao del fibroma.  Infertilidad y abortos espontneos, segn el tamao y la ubicacin del fibroma. DIAGNSTICO  El diagnstico se hace por examen fsico. El mdico puede palpar los tumores abultados al realizar el examen de la pelvis. Una ecografa puede brindar informacin adicional importante acerca del tamao, la ubicacin y el nmero de tumores. Es raro que sea Passenger transport manager otras pruebas, como tomografa computada o Health visitor.  TRATAMIENTO   Su mdico puede considerar que es conveniente esperar y Pharmacologist. Esto incluye el control del fibroma por parte del mdico para observar si crece o disminuye su tamao.   Podr recomendarle un tratamiento hormonal o el uso de un dispositivo intrauterino (DIU).   En algunos casos es necesaria la ciruga para extirpar el fibroma (miomectoma) o el tero (histerectoma). Esto depender de su situacin. Cuando una mujer desea quedar embarazada y los fibromas interfieren en su fertilidad, el mdico puede recomendar la extirpacin del fibroma.    INSTRUCCIONES PARA EL CUIDADO EN EL HOGAR  Los cuidados en el hogar dependen del tratamiento que haya recibido. En general:   Concurra puntualmente a las citas de control con el mdico.   Slo tome los medicamentos que le indic su mdico. No tome aspirina. Puede ocasionar hemorragias.   Si tiene perodos muy abundantes y debe cambiar un tampn o una toalla higinica en media hora o menos, comunquese con su mdico inmediatamente. Si sus perodos son molestos pero no tan abundantes, acustese con los pies ligeramente elevados por encima del nivel del corazn. Coloque compresas fras en la zona inferior del abdomen.   Si sus perodos son muy abundantes  , anote el nmero de compresas o tampones que [place] cada mes. Lleve esta informacin cuando visite a su mdico.   Consulte al mdico si debe tomar pldoras de hierro.   Incluya vegetales en su dieta.   Si le recetaron un tratamiento hormonal, tome los medicamentos hormonales como le indicaron.   Si le indicaron la ciruga, pdale informacin especfica a su mdico.  SOLICITE ATENCIN MDICA DE INMEDIATO SI:   Siente dolor o clicos en la pelvis y no puede controlarlos con los medicamentos.   El dolor en la pelvis aumenta de manera repentina.   Aumenta el sangrado entre los perodos o [employer].   Se siente mareado o tiene episodios de [place].  ASEGRESE DE QUE:   Comprende estas instrucciones.  Controlar su enfermedad.  Solicitar ayuda de inmediato si no mejora o si empeora. Document Released: 01/26/2005 Document Revised: 04/20/2011 [facility] Patient Information 2014 [place], [place]. Histerectoma laparoscpica vaginal asistida (Laparoscopic  Assisted Vaginal Hysterectomy) Neomia Dear histerectoma laparoscpica asistida vaginal es un procedimiento quirrgico para extirpar el tero y el cuello uterino, ya veces los ovarios y las trompas de [place]. Durante este procedimiento, la extirpacin [place] se realiza a  travs de la vagina y el resto se hace a travs de unos pequeos cortes quirrgicos (incisiones) en el abdomen.  Este procedimiento se suele considerar en aquellas mujeres en las que la histerectoma vaginal no es una opcin. Durante la [place], su cirujano le [employer] riesgos y beneficios de las diferentes tcnicas quirrgicas. Los sntomas como dolor plvico crnico, presin, y perodos menstruales abundantes o dolorosos se pueden tratar con este procedimiento. En general, el tiempo de recuperacin es ms rpido y hay menos complicaciones despus de [place] laparoscpica que despus de los procedimientos en que se abren incisiones.  INFORME A SU MDICO SOBRE:   Alergias a alimentos o medicamentos.  Medicamentos que [place], incluyendo vitaminas, hierbas, gotas oftlmicas, medicamentos de venta libre y cremas.  Uso de corticoides (por va oral o cremas).  Problemas anteriores debido a anestsicos o a medicamentos que [employer] sensibilidad.  Antecedentes de hemorragias o cogulos sanguneos.  Cirugas anteriores.  Otros problemas de salud, incluyendo diabetes y problemas renales.  Posible embarazo. RIESGOS Y COMPLICACIONES   Reaccin alrgica a los medicamentos.  Dificultad para respirar.  Sangrado.  Infeccin.  Daos en otras estructuras del tero y el cuello del tero. ANTES DEL PROCEDIMIENTO   Consulte a su mdico si debe cambiar o suspender los medicamentos que toma habitualmente.  [employer], como los que prepararn el vaciado del colon, segn las indicaciones.  No coma ni beba nada durante al menos 8 horas antes del procedimiento.  La noche anterior al procedimiento tome una ducha en su hogar.  Concurra sin joyas, maquillaje, esmalte de uas ni lociones o desodorante.  Si fuma, abandone el hbito. Si deja de fumar mejorar su salud despus de la [place].  Solicite que la lleven a su casa luego de la ciruga y que alguien la ayude con las tareas  de la casa durante el tiempo de la recuperacin. PROCEDIMIENTO    Le colocarn una va intravenosa (IV) en una vena para administrarle lquidos y medicamentos.  Recibir medicamentos para relajarse y otros que lo harn dormir (anestesia general).  Le colocarn un tubo flexible (catter) en la vejiga para drenar la orina.  Tambin insertarn un tubo a travs de la nariz o la boca hacia el estmago (sonda nasogstrica). La sonda nasogstrica drena los jugos digestivos e impide que sienta nuseas o tenga vmitos.  Le colocarn medias ajustadas (compresin) en las piernas para Armed forces technical officer.  Se hacen tres o cuatro incisiones pequeas en el abdomen. Tambin se hace una incisin en la vagina. Se insertan sondas e instrumentos en las pequeas incisiones. Se extirpan ell tero y el cuello uterino (y posiblemente los ovarios y las trompas de [place]) a travs de la vagina, as como a travs de las 3 o 4 pequeas incisiones que se hicieron en el abdomen.  La vagina se sutura para que vuelva a su estado normal.  El procedimiento demora entre 1 y 3 horas. DESPUS DEL PROCEDIMIENTO   Piense que deber [employer] hospital durante 1 a 2 das.  Siente clicos abdominales y dolor de Advertising copywriter. Tiene clicos que no se [employer].  Tendr que seguir una dieta lquida durante algn tiempo. Probablemente regrese, y [place], su dieta habitual el da siguiente a la [place].  Eliminar  la orina a travs de un catter. ste ser retirado el da posterior a la [place].  Le harn controles regulares de la temperatura, frecuencia cardaca y respiratoria, presin arterial y nivel de oxgeno.  Deber Chemical engineer medias de compresin en las piernas hasta que pueda moverse.  Utilizar un dispositivo especial o har ejercicios respiratorios para [employer] pulmones limpios.  La alentarn a caminar lo antes posible.  La recuperacin completa es esperable en 4 a 6 semanas despus  de la [place]. Document Released: 01/15/2011 Document Revised: 04/20/2011 [facility] Patient Information 2014 [place], [place].

## 2012-07-12 ENCOUNTER — Encounter (HOSPITAL_COMMUNITY): Payer: Self-pay | Admitting: Pharmacist

## 2012-07-13 ENCOUNTER — Encounter (HOSPITAL_COMMUNITY): Payer: Self-pay

## 2012-07-13 ENCOUNTER — Encounter (HOSPITAL_COMMUNITY)
Admission: RE | Admit: 2012-07-13 | Discharge: 2012-07-13 | Disposition: A | Discharge status: Home / Self Care | Payer: Self-pay | Source: Ambulatory Visit | Attending: Obstetrics & Gynecology | Admitting: Obstetrics & Gynecology

## 2012-07-13 LAB — CBC
HCT: 33.8 % — ABNORMAL LOW (ref 36.0–46.0)
MCV: 93.4 fL (ref 78.0–100.0)
Platelets: 326 10*3/uL (ref 150–400)
RBC: 3.62 MIL/uL — ABNORMAL LOW (ref 3.87–5.11)
WBC: 5.3 10*3/uL (ref 4.0–10.5)

## 2012-07-13 NOTE — Patient Instructions (Addendum)
Jill Meza  07/13/2012   Your procedure is scheduled on:  07/14/12  Enter through the Main Entrance of [facility] at 8 AM.  Pick up the phone at the desk and dial [phone].   Call this number if you have problems the morning of surgery: [phone]   Remember:   Do not eat food:After Midnight.  Do not drink clear liquids: After Midnight.  Take these medicines the morning of surgery with A SIP OF WATER: NA   Do not wear jewelry, make-up or nail polish.  Do not wear lotions, powders, or perfumes. You may wear deodorant.  Do not shave 48 hours prior to surgery.  Do not bring valuables to the hospital.  [facility] is not responsible                  for any belongings or valuables brought to the hospital.  Contacts, dentures or bridgework may not be worn into surgery.  Leave suitcase in the car. After surgery it may be brought to your room.  For patients admitted to the hospital, checkout time is 11:00 AM the day of                discharge.   Patients discharged the day of surgery will not be allowed to drive                   home.  Name and phone number of your driver: NA  Special Instructions: Shower using CHG 2 nights before surgery and the night before surgery.  If you shower the day of surgery use CHG.  Use special wash - you have one bottle of CHG for all showers.  You should use approximately 1/3 of the bottle for each shower.   Please read over the following fact sheets that you were given: MRSA Information

## 2012-07-13 NOTE — Pre-Procedure Instructions (Signed)
Records from [facility] reviewed by Dr Rodman Pickle. She requests records from Stephannie Li MD (opthomologist) regarding hx of intraoccular pressure. Records requested.

## 2012-07-14 ENCOUNTER — Encounter (HOSPITAL_COMMUNITY): Payer: Self-pay | Admitting: Anesthesiology

## 2012-07-14 ENCOUNTER — Ambulatory Visit (HOSPITAL_COMMUNITY): Payer: MEDICAID | Admitting: Anesthesiology

## 2012-07-14 ENCOUNTER — Ambulatory Visit (HOSPITAL_COMMUNITY)
Admission: RE | Admit: 2012-07-14 | Discharge: 2012-07-15 | Disposition: A | Discharge status: Home / Self Care | Payer: MEDICAID | Source: Ambulatory Visit | Attending: Obstetrics & Gynecology | Admitting: Obstetrics & Gynecology

## 2012-07-14 ENCOUNTER — Encounter (HOSPITAL_COMMUNITY): Payer: Self-pay | Admitting: *Deleted

## 2012-07-14 ENCOUNTER — Encounter (HOSPITAL_COMMUNITY)
Admission: RE | Disposition: A | Discharge status: Home / Self Care | Payer: Self-pay | Source: Ambulatory Visit | Attending: Obstetrics & Gynecology

## 2012-07-14 DIAGNOSIS — Z01812 Encounter for preprocedural laboratory examination: Secondary | ICD-10-CM | POA: Insufficient documentation

## 2012-07-14 DIAGNOSIS — D219 Benign neoplasm of connective and other soft tissue, unspecified: Secondary | ICD-10-CM

## 2012-07-14 DIAGNOSIS — D62 Acute posthemorrhagic anemia: Secondary | ICD-10-CM

## 2012-07-14 DIAGNOSIS — D649 Anemia, unspecified: Secondary | ICD-10-CM | POA: Insufficient documentation

## 2012-07-14 DIAGNOSIS — N838 Other noninflammatory disorders of ovary, fallopian tube and broad ligament: Secondary | ICD-10-CM | POA: Insufficient documentation

## 2012-07-14 DIAGNOSIS — N841 Polyp of cervix uteri: Secondary | ICD-10-CM | POA: Insufficient documentation

## 2012-07-14 DIAGNOSIS — Z01818 Encounter for other preprocedural examination: Secondary | ICD-10-CM | POA: Insufficient documentation

## 2012-07-14 DIAGNOSIS — D259 Leiomyoma of uterus, unspecified: Secondary | ICD-10-CM

## 2012-07-14 DIAGNOSIS — N920 Excessive and frequent menstruation with regular cycle: Secondary | ICD-10-CM | POA: Insufficient documentation

## 2012-07-14 HISTORY — PX: ABDOMINAL HYSTERECTOMY: SHX81

## 2012-07-14 HISTORY — PX: LAPAROSCOPIC ASSISTED VAGINAL HYSTERECTOMY: SHX5398

## 2012-07-14 HISTORY — PX: BILATERAL SALPINGECTOMY: SHX5743

## 2012-07-14 LAB — PREGNANCY, URINE: Preg Test, Ur: NEGATIVE

## 2012-07-14 SURGERY — HYSTERECTOMY, VAGINAL, LAPAROSCOPY-ASSISTED
Anesthesia: General | Site: Abdomen | Wound class: Clean Contaminated

## 2012-07-14 MED ORDER — KETOROLAC TROMETHAMINE 30 MG/ML IJ SOLN
30.0000 mg | Freq: Four times a day (QID) | INTRAMUSCULAR | Status: DC
  Administered 2012-07-14 – 2012-07-15 (×2): 30 mg via INTRAVENOUS
  Filled 2012-07-14 (×2): qty 1

## 2012-07-14 MED ORDER — DEXAMETHASONE SODIUM PHOSPHATE 10 MG/ML IJ SOLN
INTRAMUSCULAR | Status: AC
  Filled 2012-07-14: qty 1

## 2012-07-14 MED ORDER — ONDANSETRON HCL 4 MG/2ML IJ SOLN
4.0000 mg | Freq: Four times a day (QID) | INTRAMUSCULAR | Status: DC | PRN
  Administered 2012-07-14: 4 mg via INTRAVENOUS
  Filled 2012-07-14: qty 2

## 2012-07-14 MED ORDER — NEOSTIGMINE METHYLSULFATE 1 MG/ML IJ SOLN
INTRAMUSCULAR | Status: DC | PRN
  Administered 2012-07-14: 2 mg via INTRAVENOUS

## 2012-07-14 MED ORDER — ONDANSETRON HCL 4 MG/2ML IJ SOLN
INTRAMUSCULAR | Status: DC | PRN
  Administered 2012-07-14: 4 mg via INTRAVENOUS

## 2012-07-14 MED ORDER — MENTHOL 3 MG MT LOZG: 1.0000 | LOZENGE | OROMUCOSAL | Status: DC | PRN

## 2012-07-14 MED ORDER — CEFAZOLIN SODIUM-DEXTROSE 2-3 GM-% IV SOLR
2.0000 g | INTRAVENOUS | Status: AC
  Administered 2012-07-14: 2 g via INTRAVENOUS

## 2012-07-14 MED ORDER — BUPIVACAINE HCL (PF) 0.5 % IJ SOLN
INTRAMUSCULAR | Status: AC
  Filled 2012-07-14: qty 30

## 2012-07-14 MED ORDER — ONDANSETRON HCL 4 MG PO TABS: 4.0000 mg | ORAL_TABLET | Freq: Four times a day (QID) | ORAL | Status: DC | PRN

## 2012-07-14 MED ORDER — HYDROMORPHONE HCL PF 1 MG/ML IJ SOLN
0.2500 mg | INTRAMUSCULAR | Status: DC | PRN
Start: 2012-07-14 — End: 2012-07-14
  Administered 2012-07-14: 0.5 mg via INTRAVENOUS

## 2012-07-14 MED ORDER — ROPIVACAINE HCL 5 MG/ML IJ SOLN
INTRAMUSCULAR | Status: AC
  Filled 2012-07-14: qty 60

## 2012-07-14 MED ORDER — ROPIVACAINE HCL 5 MG/ML IJ SOLN
INTRAMUSCULAR | Status: DC | PRN
  Administered 2012-07-14: 60 mL

## 2012-07-14 MED ORDER — BUPIVACAINE HCL (PF) 0.5 % IJ SOLN
INTRAMUSCULAR | Status: DC | PRN
  Administered 2012-07-14: 30 mL

## 2012-07-14 MED ORDER — ACETAMINOPHEN 160 MG/5ML PO SOLN
ORAL | Status: AC
  Administered 2012-07-14: 975 mg via ORAL
  Filled 2012-07-14: qty 40.6

## 2012-07-14 MED ORDER — STERILE WATER FOR IRRIGATION IR SOLN
Status: DC | PRN
  Administered 2012-07-14: 1000 mL via INTRAVESICAL

## 2012-07-14 MED ORDER — SIMETHICONE 80 MG PO CHEW: 80.0000 mg | CHEWABLE_TABLET | Freq: Four times a day (QID) | ORAL | Status: DC | PRN

## 2012-07-14 MED ORDER — IBUPROFEN 600 MG PO TABS: 600.0000 mg | ORAL_TABLET | Freq: Four times a day (QID) | ORAL | Status: DC | PRN

## 2012-07-14 MED ORDER — MUPIROCIN 2 % EX OINT: TOPICAL_OINTMENT | Freq: Two times a day (BID) | CUTANEOUS | Status: DC

## 2012-07-14 MED ORDER — FENTANYL CITRATE 0.05 MG/ML IJ SOLN
INTRAMUSCULAR | Status: DC | PRN
  Administered 2012-07-14 (×3): 50 ug via INTRAVENOUS
  Administered 2012-07-14: 100 ug via INTRAVENOUS
  Administered 2012-07-14 (×2): 50 ug via INTRAVENOUS
  Administered 2012-07-14: 100 ug via INTRAVENOUS
  Administered 2012-07-14: 50 ug via INTRAVENOUS

## 2012-07-14 MED ORDER — ONDANSETRON HCL 4 MG/2ML IJ SOLN
INTRAMUSCULAR | Status: AC
  Filled 2012-07-14: qty 2

## 2012-07-14 MED ORDER — OXYCODONE-ACETAMINOPHEN 5-325 MG PO TABS: 1.0000 | ORAL_TABLET | Freq: Four times a day (QID) | ORAL | Status: DC | PRN

## 2012-07-14 MED ORDER — HYDROMORPHONE HCL PF 1 MG/ML IJ SOLN
INTRAMUSCULAR | Status: AC
  Administered 2012-07-14: 0.5 mg via INTRAVENOUS
  Filled 2012-07-14: qty 1

## 2012-07-14 MED ORDER — DEXTROSE-NACL 5-0.45 % IV SOLN
INTRAVENOUS | Status: DC
  Administered 2012-07-14: 14:00:00 via INTRAVENOUS

## 2012-07-14 MED ORDER — LIDOCAINE HCL (CARDIAC) 20 MG/ML IV SOLN
INTRAVENOUS | Status: DC | PRN
  Administered 2012-07-14: 50 mg via INTRAVENOUS
  Administered 2012-07-14: 30 mg via INTRAVENOUS
  Administered 2012-07-14: 20 mg via INTRAVENOUS

## 2012-07-14 MED ORDER — ESTRADIOL 0.1 MG/GM VA CREA
TOPICAL_CREAM | VAGINAL | Status: AC
  Filled 2012-07-14: qty 42.5

## 2012-07-14 MED ORDER — PHENYLEPHRINE 40 MCG/ML (10ML) SYRINGE FOR IV PUSH (FOR BLOOD PRESSURE SUPPORT)
PREFILLED_SYRINGE | INTRAVENOUS | Status: AC
  Filled 2012-07-14: qty 5

## 2012-07-14 MED ORDER — VASOPRESSIN 20 UNIT/ML IJ SOLN
INTRAMUSCULAR | Status: AC
  Filled 2012-07-14: qty 1

## 2012-07-14 MED ORDER — ROCURONIUM BROMIDE 50 MG/5ML IV SOLN
INTRAVENOUS | Status: AC
  Filled 2012-07-14: qty 1

## 2012-07-14 MED ORDER — MIDAZOLAM HCL 2 MG/2ML IJ SOLN
INTRAMUSCULAR | Status: AC
  Filled 2012-07-14: qty 2

## 2012-07-14 MED ORDER — PHENYLEPHRINE HCL 10 MG/ML IJ SOLN
INTRAMUSCULAR | Status: DC | PRN
  Administered 2012-07-14: 40 ug via INTRAVENOUS
  Administered 2012-07-14: 20 ug via INTRAVENOUS
  Administered 2012-07-14 (×2): 80 ug via INTRAVENOUS
  Administered 2012-07-14: 20 ug via INTRAVENOUS
  Administered 2012-07-14: 40 ug via INTRAVENOUS

## 2012-07-14 MED ORDER — INDIGOTINDISULFONATE SODIUM 8 MG/ML IJ SOLN
INTRAMUSCULAR | Status: AC
  Filled 2012-07-14: qty 5

## 2012-07-14 MED ORDER — FENTANYL CITRATE 0.05 MG/ML IJ SOLN
INTRAMUSCULAR | Status: AC
  Filled 2012-07-14: qty 5

## 2012-07-14 MED ORDER — SODIUM CHLORIDE 0.9 % IJ SOLN
INTRAMUSCULAR | Status: DC | PRN
  Administered 2012-07-14: 3 mL

## 2012-07-14 MED ORDER — DEXAMETHASONE SODIUM PHOSPHATE 4 MG/ML IJ SOLN
INTRAMUSCULAR | Status: DC | PRN
  Administered 2012-07-14: 10 mg via INTRAVENOUS

## 2012-07-14 MED ORDER — VASOPRESSIN 20 UNIT/ML IJ SOLN
INTRAVENOUS | Status: DC | PRN
  Administered 2012-07-14: 11:00:00 via INTRAMUSCULAR

## 2012-07-14 MED ORDER — CEFAZOLIN SODIUM-DEXTROSE 2-3 GM-% IV SOLR
INTRAVENOUS | Status: AC
  Filled 2012-07-14: qty 50

## 2012-07-14 MED ORDER — OXYCODONE-ACETAMINOPHEN 5-325 MG PO TABS
1.0000 | ORAL_TABLET | ORAL | Status: DC | PRN
  Administered 2012-07-14 (×2): 1 via ORAL
  Administered 2012-07-15: 2 via ORAL
  Filled 2012-07-14 (×2): qty 1
  Filled 2012-07-14: qty 2

## 2012-07-14 MED ORDER — LACTATED RINGERS IV SOLN: INTRAVENOUS | Status: DC

## 2012-07-14 MED ORDER — KETOROLAC TROMETHAMINE 30 MG/ML IJ SOLN: 15.0000 mg | Freq: Once | INTRAMUSCULAR | Status: DC | PRN

## 2012-07-14 MED ORDER — LACTATED RINGERS IR SOLN
Status: DC | PRN
  Administered 2012-07-14: 3000 mL

## 2012-07-14 MED ORDER — LIDOCAINE HCL (CARDIAC) 20 MG/ML IV SOLN
INTRAVENOUS | Status: AC
  Filled 2012-07-14: qty 5

## 2012-07-14 MED ORDER — ACETAMINOPHEN 160 MG/5ML PO SOLN: 975.0000 mg | Freq: Once | ORAL | Status: AC

## 2012-07-14 MED ORDER — MIDAZOLAM HCL 5 MG/5ML IJ SOLN
INTRAMUSCULAR | Status: DC | PRN
  Administered 2012-07-14: 2 mg via INTRAVENOUS

## 2012-07-14 MED ORDER — KETOROLAC TROMETHAMINE 30 MG/ML IJ SOLN: 30.0000 mg | Freq: Four times a day (QID) | INTRAMUSCULAR | Status: DC

## 2012-07-14 MED ORDER — KETOROLAC TROMETHAMINE 30 MG/ML IJ SOLN
INTRAMUSCULAR | Status: AC
  Filled 2012-07-14: qty 1

## 2012-07-14 MED ORDER — MORPHINE SULFATE 4 MG/ML IJ SOLN: 2.0000 mg | INTRAMUSCULAR | Status: DC | PRN

## 2012-07-14 MED ORDER — NEOSTIGMINE METHYLSULFATE 1 MG/ML IJ SOLN
INTRAMUSCULAR | Status: AC
  Filled 2012-07-14: qty 1

## 2012-07-14 MED ORDER — ROCURONIUM BROMIDE 100 MG/10ML IV SOLN
INTRAVENOUS | Status: DC | PRN
  Administered 2012-07-14: 35 mg via INTRAVENOUS
  Administered 2012-07-14: 5 mg via INTRAVENOUS
  Administered 2012-07-14: 10 mg via INTRAVENOUS

## 2012-07-14 MED ORDER — MUPIROCIN 2 % EX OINT
TOPICAL_OINTMENT | CUTANEOUS | Status: AC
  Filled 2012-07-14: qty 22

## 2012-07-14 MED ORDER — GLYCOPYRROLATE 0.2 MG/ML IJ SOLN
INTRAMUSCULAR | Status: AC
  Filled 2012-07-14: qty 3

## 2012-07-14 MED ORDER — INDIGOTINDISULFONATE SODIUM 8 MG/ML IJ SOLN
INTRAMUSCULAR | Status: DC | PRN
  Administered 2012-07-14: 5 mL via INTRAVENOUS

## 2012-07-14 MED ORDER — PROPOFOL 10 MG/ML IV BOLUS
INTRAVENOUS | Status: DC | PRN
  Administered 2012-07-14: 180 mg via INTRAVENOUS

## 2012-07-14 MED ORDER — PROPOFOL 10 MG/ML IV EMUL
INTRAVENOUS | Status: AC
  Filled 2012-07-14: qty 20

## 2012-07-14 MED ORDER — LACTATED RINGERS IV SOLN
INTRAVENOUS | Status: DC
  Administered 2012-07-14 (×2): via INTRAVENOUS

## 2012-07-14 MED ORDER — GLYCOPYRROLATE 0.2 MG/ML IJ SOLN
INTRAMUSCULAR | Status: DC | PRN
  Administered 2012-07-14: 0.4 mg via INTRAVENOUS

## 2012-07-14 SURGICAL SUPPLY — 50 items
CABLE HIGH FREQUENCY MONO STRZ (ELECTRODE) IMPLANT
CATH ROBINSON RED A/P 16FR (CATHETERS) ×4 IMPLANT
CHLORAPREP W/TINT 26ML (MISCELLANEOUS) ×3 IMPLANT
CLOTH BEACON ORANGE TIMEOUT ST (SAFETY) ×3 IMPLANT
CONT PATH 16OZ SNAP LID 3702 (MISCELLANEOUS) ×3 IMPLANT
COVER TABLE BACK 60X90 (DRAPES) ×3 IMPLANT
DECANTER SPIKE VIAL GLASS SM (MISCELLANEOUS) ×3 IMPLANT
DRSG COVADERM PLUS 2X2 (GAUZE/BANDAGES/DRESSINGS) ×3 IMPLANT
ELECT REM PT RETURN 9FT ADLT (ELECTROSURGICAL) ×3
ELECTRODE REM PT RTRN 9FT ADLT (ELECTROSURGICAL) ×2 IMPLANT
FILTER SMOKE EVAC LAPAROSHD (FILTER) ×1 IMPLANT
GAUZE PACKING 2X5 YD STERILE (GAUZE/BANDAGES/DRESSINGS) IMPLANT
GAUZE SPONGE 4X4 16PLY XRAY LF (GAUZE/BANDAGES/DRESSINGS) ×1 IMPLANT
GLOVE BIO SURGEON STRL SZ7 (GLOVE) ×7 IMPLANT
GLOVE BIOGEL PI IND STRL 6.5 (GLOVE) ×2 IMPLANT
GLOVE BIOGEL PI IND STRL 7.0 (GLOVE) ×2 IMPLANT
GLOVE BIOGEL PI INDICATOR 6.5 (GLOVE) ×3
GLOVE BIOGEL PI INDICATOR 7.0 (GLOVE) ×3
GLOVE ECLIPSE 6.0 STRL STRAW (GLOVE) ×2 IMPLANT
GOWN PREVENTION PLUS XLARGE (GOWN DISPOSABLE) ×3 IMPLANT
GOWN STRL REIN XL XLG (GOWN DISPOSABLE) ×13 IMPLANT
MANIPULATOR UTERINE 4.5 ZUMI (MISCELLANEOUS) ×3 IMPLANT
NEEDLE INSUFFLATION 120MM (ENDOMECHANICALS) ×1 IMPLANT
NS IRRIG 1000ML POUR BTL (IV SOLUTION) ×3 IMPLANT
PACK LAVH (CUSTOM PROCEDURE TRAY) ×3 IMPLANT
PROTECTOR NERVE ULNAR (MISCELLANEOUS) ×5 IMPLANT
SCALPEL HARMONIC ACE (MISCELLANEOUS) ×1 IMPLANT
SCISSORS LAP 5X35 DISP (ENDOMECHANICALS) IMPLANT
SET CYSTO W/LG BORE CLAMP LF (SET/KITS/TRAYS/PACK) ×1 IMPLANT
SET IRRIG TUBING LAPAROSCOPIC (IRRIGATION / IRRIGATOR) ×1 IMPLANT
SOLUTION ELECTROLUBE (MISCELLANEOUS) IMPLANT
STRIP CLOSURE SKIN 1/4X3 (GAUZE/BANDAGES/DRESSINGS) IMPLANT
SUT VIC AB 0 CT1 18XCR BRD8 (SUTURE) ×6 IMPLANT
SUT VIC AB 0 CT1 27 (SUTURE) ×6
SUT VIC AB 0 CT1 27XBRD ANBCTR (SUTURE) ×4 IMPLANT
SUT VIC AB 0 CT1 36 (SUTURE) ×1 IMPLANT
SUT VIC AB 0 CT1 8-18 (SUTURE) ×9
SUT VIC AB 3-0 SH 18 (SUTURE) IMPLANT
SUT VIC AB 3-0 X1 27 (SUTURE) ×3 IMPLANT
SUT VICRYL 0 TIES 12 18 (SUTURE) ×3 IMPLANT
SUT VICRYL 0 UR6 27IN ABS (SUTURE) ×6 IMPLANT
SUT VICRYL 4-0 PS2 18IN ABS (SUTURE) ×3 IMPLANT
SYR 30ML LL (SYRINGE) ×3 IMPLANT
TOWEL OR 17X24 6PK STRL BLUE (TOWEL DISPOSABLE) ×10 IMPLANT
TRAY FOLEY CATH 14FR (SET/KITS/TRAYS/PACK) ×3 IMPLANT
TROCAR BALLN 12MMX100 BLUNT (TROCAR) ×3 IMPLANT
TROCAR OPTI TIP 5M 100M (ENDOMECHANICALS) ×1 IMPLANT
TROCAR XCEL NON-BLD 5MMX100MML (ENDOMECHANICALS) ×3 IMPLANT
WARMER LAPAROSCOPE (MISCELLANEOUS) ×3 IMPLANT
WATER STERILE IRR 1000ML POUR (IV SOLUTION) ×3 IMPLANT

## 2012-07-14 NOTE — Progress Notes (Signed)
Spoke with Dr Marice Potter regarding pt.'s inability to walk without vomiting. Dr Marice Potter gave order to insert foley. I explained to the pt what a foley catheter is and what it is for and the pt stated that she wanted to try to walk to the bathroom again. The pt walked to the bathroom without vomiting and voided 300cc. The pt then walked back to bed.

## 2012-07-14 NOTE — Anesthesia Postprocedure Evaluation (Signed)
  Anesthesia Post-op Note  Anesthesia Post Note  Patient: Jill Meza  Procedure(s) Performed: Procedure(s) (LRB): LAPAROSCOPIC ASSISTED VAGINAL HYSTERECTOMY (N/A) BILATERAL SALPINGECTOMY (Bilateral)  Anesthesia type: General  Patient location: PACU  Post pain: Pain level controlled  Post assessment: Post-op Vital signs reviewed  Last Vitals:  Filed Vitals:   07/14/12 1322  BP:   Pulse: 70  Temp: 37.2 C  Resp: 20    Post vital signs: Reviewed  Level of consciousness: sedated  Complications: No apparent anesthesia complications.  EKG changes noted in OR immediately on application of EKG monitor.  Widened QRS with delta wave, ST changes.  The changes would become more pronounced with increases in heart rate (throughout surgery) but decrease with decreases in HR.  BP stable.  12 lead EKG in PACU showed same findings.  Discussed with Dr Erin Fulling who plans to call cardiology to assess EKG. Jasmine December, MD

## 2012-07-14 NOTE — Anesthesia Postprocedure Evaluation (Signed)
  Anesthesia Post-op Note  Patient: Jill Meza  Procedure(s) Performed: Procedure(s): LAPAROSCOPIC ASSISTED VAGINAL HYSTERECTOMY (N/A) BILATERAL SALPINGECTOMY (Bilateral)  Patient Location: PACU and Women's Unit  Anesthesia Type:General  Level of Consciousness: awake, alert  and oriented  Airway and Oxygen Therapy: Patient Spontanous Breathing  Post-op Pain: none  Post-op Assessment: Post-op Vital signs reviewed and Patient's Cardiovascular Status Stable  Post-op Vital Signs: Reviewed and stable  Complications: No apparent anesthesia complications

## 2012-07-14 NOTE — Progress Notes (Signed)
Attempted to get pt out of bed and walk. Pt sat at the bedside and c/o nausea. Pt laid in a semi-fowlers position in the bed and began to vomit. Pt stated that she felt better and did not want nausea medication.

## 2012-07-14 NOTE — Transfer of Care (Signed)
Immediate Anesthesia Transfer of Care Note  Patient: Jill Meza  Procedure(s) Performed: Procedure(s) with comments: LAPAROSCOPIC ASSISTED VAGINAL HYSTERECTOMY (N/A) - removal of bilateral salpingectomy   Patient Location: PACU  Anesthesia Type:General  Level of Consciousness: awake, oriented and patient cooperative  Airway & Oxygen Therapy: Patient Spontanous Breathing and Patient connected to nasal cannula oxygen  Post-op Assessment: Report given to PACU RN and Post -op Vital signs reviewed and stable  Post vital signs: Reviewed and stable  Complications: No apparent anesthesia complications

## 2012-07-14 NOTE — Op Note (Signed)
07/14/2012  12:49 PM  PATIENT:  Jill Meza  42 y.o. female  PRE-OPERATIVE DIAGNOSIS:  Fibroid uterus; anemia; pedunculated cervical fibroid   POST-OPERATIVE DIAGNOSIS:  Fibroid uterus; anemia;  pedunculated cervical fibroid   PROCEDURE:  Procedure(s): LAPAROSCOPIC ASSISTED VAGINAL HYSTERECTOMY (N/A) BILATERAL SALPINGECTOMY (Bilateral); cystoscopy  SURGEON:  Surgeon(s) and Role:    * Willodean Rosenthal, MD - Primary    * Lesly Dukes, MD - Assisting  ANESTHESIA:   local, regional and general  EBL:  Total I/O In: 1600 [I.V.:1600] Out: 850 [Urine:700; Blood:150]  BLOOD ADMINISTERED:none  DRAINS: none   LOCAL MEDICATIONS USED:  MARCAINE    and OTHER Ropivicaine  SPECIMEN:  Source of Specimen:  uterus; fallopian tubes and pedunculated fibroid  DISPOSITION OF SPECIMEN:  PATHOLOGY  COUNTS:  YES  TOURNIQUET:  * No tourniquets in log *  DICTATION: .Note written in EPIC  PLAN OF CARE: admit for extended observation then discharge to home  PATIENT DISPOSITION:  PACU - hemodynamically stable.   Delay start of Pharmacological VTE agent (>24hrs) due to surgical blood loss or risk of bleeding: yes  INDICATIONS: 42yo female G60P4 with aforementioned preoprative diagnoses here today for definitive surgical management.   Risks of surgery were discussed with the patient and her spouse using a Spanish interpreter.  The risks reviewed were  including but not limited to: bleeding which may require transfusion or reoperation; infection which may require antibiotics; injury to bowel, bladder, ureters or other surrounding organs; need for additional procedures including laparotomy; thromboembolic phenomenon, incisional problems and other postoperative/anesthesia complications. Written informed consent was obtained.    FINDINGS: 14 week sized uterus.  Normal ovaries and fallopian tubes.  Large 5-6 cm pedunculated fibroid from cervix. Normal upper abdomen.  PROCEDURE IN  DETAIL:  The patient received intravenous antibiotics and had sequential compression devices applied to her lower extremities while in the preoperative area.  She was then taken to the operating room where general anesthesia was administered and was found to be adequate.  She was placed in the dorsal lithotomy position, and was prepped and draped in a sterile manner. After an adequate timeout was performed, a Foley catheter was inserted into her bladder and attached to constant drainage and a HUMI uterine manipulator was then advanced into the uterus .  , attention was then turned to the patient's abdomen where a 5-mm skin incision was made in the umbilicus.  A Veress needle was placed through this incision placement was confirmed using a syringe with normal saline.  A pneumoperitoneum of CO2 gas was used to insufflate the abdomen. A 5mm trocar was then placed through the incision.  After intraperitoneal placement was confirmed a pneumoperitoneum was obtained with carbon dioxide gas.  A survey of the patient's pelvis and abdomen revealed the above anatomy.   Bilateral 5-mm lower quadrant ports  were then placed under direct visualization.  The pelvis was then carefully examined.   On the left side the round ligament was clamped, transected and ligated  with the Harmonic scapel device.  The uteroovarian ligament was also clamped and transected, and the fallopian tube on the right  was freed from the underlying mesosalpinx.  Excellent hemostasis was noted.  The broad ligament was serially clamped using the Harmonic to below  the level of the uterine artieries. Excellent hemostasis was noted.  The same was carried out on the right side with excellent hemostasis. The decision was made to leave the trocars in place and proceed with completing the  hysterectomy via the vaginal route .  Attention was then turned to her pelvis.  A weighted speculum was then placed in the vagina.  The cervical polyp was twisted off to allow  visibility of the cervix. The anterior and posterior lips of the cervix were grasped bilaterally with [name] tenaculums. The cervix was then injected circumferentially with dilute vasopressin solution.  The cervix was then circumferentially incised, and the anterior cul-de-sac was entered sharply without difficulty and a retractor was placed.  The same procedure was performed posteriorly and the posterior cul-de-sac was entered sharply without difficulty.  A long weighted speculum was inserted into the posterior cul-de-sac.  The Heaney clamp was then used to clamp the uterosacral ligaments on either side.  They were then cut and sutured ligated with 0 Vicryl, and were held with a tag for later identification. Of note, all sutures used in this case were 0 Vicryl unless otherwise noted.   The cardinal ligaments were then clamped, cut and ligated bilaterally. The uterus was noted to be freed from all ligaments and was then delivered and sent to pathology.  Excellent hemostasis was noted at this point. The uterus was noted to be freed from all ligaments and was then delivered and sent to pathology.   After completion of the hysterectomy, all pedicles from the uterosacral ligament to the cornua were examined hemostasis was confirmed.  The uterosacral pedicles were then affixed to the ipsilateral vaginal cuff angles using 0 Vicryl sutures.  The vaginal cuff was then closed in a running locked fashion with 0 Vicryl.  All instruments were then removed from the pelvis.   At this point a cystoscopy was performed and there was bilateral flow of blue urine (pt had been given indigo carmine by anesthesia)    Attention was then returned to her abdomen which was insufflated again with carbon dioxide gas.  The laparoscope was used to survey the operative site, and it was found to be hemostatic.   No intraoperative injury to other surrounding organs was noted. 60cc of Ropivacaine was injected into the pelvis over the vaginal  cuff.  The abdomen was desufflated and all instruments were then removed from the patient's abdomen. The port sites were repaired with 4-0 vicryl and all of the sites were injected with 0.5% Marcaine.  Benzoin and steri strips were applied. The patient tolerated the procedures well.  All instruments, needles, and sponge counts were correct x 2. The patient was taken to the recovery room awake, extubated and in stable condition.

## 2012-07-14 NOTE — Op Note (Signed)
07/14/2012  12:49 PM  PATIENT:  Jill Meza  42 y.o. female  PRE-OPERATIVE DIAGNOSIS:  Fibroid uterus; anemia; pedunculated cervical fibroid   POST-OPERATIVE DIAGNOSIS:  Fibroid uterus; anemia;  pedunculated cervical fibroid   PROCEDURE:  Procedure(s): LAPAROSCOPIC ASSISTED VAGINAL HYSTERECTOMY (N/A) BILATERAL SALPINGECTOMY (Bilateral); cystoscopy  SURGEON:  Surgeon(s) and Role:    * Willodean Rosenthal, MD - Primary    * Lesly Dukes, MD - Assisting  ANESTHESIA:   local, regional and general  EBL:  Total I/O In: 1600 [I.V.:1600] Out: 850 [Urine:700; Blood:150]  BLOOD ADMINISTERED:none  DRAINS: none   LOCAL MEDICATIONS USED:  MARCAINE    and OTHER Ropivicaine  SPECIMEN:  Source of Specimen:  uterus; fallopian tubes and pedunculated fibroid  DISPOSITION OF SPECIMEN:  PATHOLOGY  COUNTS:  YES  TOURNIQUET:  * No tourniquets in log *  DICTATION: .Note written in EPIC  PLAN OF CARE: admit for extended observation then discharge to home  PATIENT DISPOSITION:  PACU - hemodynamically stable.   Delay start of Pharmacological VTE agent (>24hrs) due to surgical blood loss or risk of bleeding: yes  INDICATIONS: 42yo female G4P3 with aforementioned preoprative diagnoses here today for definitive surgical management.   Risks of surgery were discussed with the patient including but not limited to: bleeding which may require transfusion or reoperation; infection which may require antibiotics; injury to bowel, bladder, ureters or other surrounding organs; need for additional procedures including laparotomy; thromboembolic phenomenon, incisional problems and other postoperative/anesthesia complications. Written informed consent was obtained.    FINDINGS: 14 week sized uterus, normal adnexa on right .  Adhesion on left side obscuring left fallopian tube.  Left ovary normal.  Evidence of endometriosis noted mainly on the left side.  Normal upper abdomen.  PROCEDURE  IN DETAIL:  The patient received intravenous antibiotics and had sequential compression devices applied to her lower extremities while in the preoperative area.  She was then taken to the operating room where general anesthesia was administered and was found to be adequate.  She was placed in the dorsal lithotomy position, and was prepped and draped in a sterile manner.  A Foley catheter was inserted into her bladder and attached to constant drainage and a HUMI uterine manipulator was then advanced into the uterus .  After an adequate timeout was performed, attention was then turned to the patient's abdomen where a 5-mm skin incision was made in the left upper quadrant.  A trocar was then placed through the incision.  After intraperitoneal placement was confirmed a pneumoperitoneum was obtained with carbon dioxide gas.  A survey of the patient's pelvis and abdomen revealed the above anatomy.   Bilateral 5-mm lower quadrant ports  were then placed under direct visualization.  The pelvis was then carefully examined.   On the right side, the round ligament was then clamped and transected with the Harmonic scapel device.  The uteroovarian ligament was also clamped and transected, and the fallopian tube on the right  was freed from the underlying mesosalpinx.  Excellent hemostasis was noted.  The broad ligament was serially clamped using the Harmonic to the level of the uterine artieries. Excellent hemostasis was noted.  The decision was made to leave the trocars in place and proceed with completing the hysterectomy via the vaginal route .  Attention was then turned to her pelvis.  A weighted speculum was then placed in the vagina, and the anterior and posterior lips of the cervix were grasped bilaterally with [name] tenaculums.  The  cervix was then injected circumferentially with dilute vasopressin solution.  The cervix was then circumferentially incised, and the anterior cul-de-sac was entered sharply without  difficulty and a retractor was placed.  The same procedure was performed posteriorly and the posterior cul-de-sac was entered sharply without difficulty.  A long weighted speculum was inserted into the posterior cul-de-sac.  The Heaney clamp was then used to clamp the uterosacral ligaments on either side.  They were then cut and sutured ligated with 0 Vicryl, and were held with a tag for later identification. Of note, all sutures used in this case were 0 Vicryl unless otherwise noted.   The cardinal ligaments were then clamped, cut and ligated bilaterally. The uterine vessels and broad ligaments were then serially clamped with the Heaney clamps, cut, and suture ligated on both sides.  The uterus was noted to be freed from all ligaments and was then delivered and sent to pathology.  Excellent hemostasis was noted at this point. The uterus was noted to be freed from all ligaments and was then delivered and sent to pathology.   After completion of the hysterectomy, all pedicles from the uterosacral ligament to the cornua were examined hemostasis was confirmed.  The uterosacral pedicles were then affixed to the ipsilateral vaginal cuff angles using 0 Vicryl sutures.  The vaginal cuff was then closed in a running locked fashion with 0 Vicryl.  All instruments were then removed from the pelvis.   At this point a cystoscopy was performed and there was bilateral flow of blue urine (pt had been given indigo carmine by anesthesia)    Attention was then returned to her abdomen which was insufflated again with carbon dioxide gas.  The laparoscope was used to survey the operative site, and it was found to be hemostatic.   No intraoperative injury to other surrounding organs was noted. Flo seal was placed over the vaginal cuff and the abdomen was desufflated and all instruments were then removed from the patient's abdomen. The port sites were repaired with 4-0 vicryl and all of the sites were injected with 0.5% Marcaine.   Benzoin and steri strips were applied. The patient tolerated the procedures well.  All instruments, needles, and sponge counts were correct x 2. The patient was taken to the recovery room awake, extubated and in stable condition.

## 2012-07-14 NOTE — Anesthesia Preprocedure Evaluation (Signed)
Anesthesia Evaluation  Patient identified by MRN, date of birth, ID band Patient awake    Reviewed: Allergy & Precautions, H&P , NPO status , Patient's Chart, lab work & pertinent test results, reviewed documented beta blocker date and time   History of Anesthesia Complications Negative for: history of anesthetic complications  Airway Mallampati: I TM Distance: >3 FB Neck ROM: full    Dental  (+) Teeth Intact   Pulmonary neg pulmonary ROS,  breath sounds clear to auscultation  Pulmonary exam normal       Cardiovascular negative cardio ROS  Rhythm:regular Rate:Normal     Neuro/Psych negative neurological ROS  negative psych ROS   GI/Hepatic negative GI ROS, Neg liver ROS,   Endo/Other  negative endocrine ROS  Renal/GU negative Renal ROS  Female GU complaint (DUB)     Musculoskeletal   Abdominal   Peds  Hematology  (+) anemia ,   Anesthesia Other Findings   Reproductive/Obstetrics negative OB ROS                           Anesthesia Physical Anesthesia Plan  ASA: II  Anesthesia Plan: General ETT   Post-op Pain Management:    Induction:   Airway Management Planned:   Additional Equipment:   Intra-op Plan:   Post-operative Plan:   Informed Consent: I have reviewed the patients History and Physical, chart, labs and discussed the procedure including the risks, benefits and alternatives for the proposed anesthesia with the patient or authorized representative who has indicated his/her understanding and acceptance.   Dental Advisory Given  Plan Discussed with: CRNA and Surgeon  Anesthesia Plan Comments:         Anesthesia Quick Evaluation

## 2012-07-14 NOTE — H&P (Signed)
  There is no change from the H&P dated 07/07/2012.  Will proceed with LAVH with bilateral salpingectomy.  Dylin Breeden L. Harraway-Smith, M.D., [facility]

## 2012-07-14 NOTE — Brief Op Note (Signed)
07/14/2012  12:49 PM  PATIENT:  Jill Meza  42 y.o. female  PRE-OPERATIVE DIAGNOSIS:  Fibroid uterus; anemia; pedunculated cervical fibroid   POST-OPERATIVE DIAGNOSIS:  Fibroid uterus; anemia;  pedunculated cervical fibroid   PROCEDURE:  Procedure(s): LAPAROSCOPIC ASSISTED VAGINAL HYSTERECTOMY (N/A) BILATERAL SALPINGECTOMY (Bilateral)  SURGEON:  Surgeon(s) and Role:    * Willodean Rosenthal, MD - Primary    * Lesly Dukes, MD - Assisting  ANESTHESIA:   local, regional and general  EBL:  Total I/O In: 1600 [I.V.:1600] Out: 850 [Urine:700; Blood:150]  BLOOD ADMINISTERED:none  DRAINS: none   LOCAL MEDICATIONS USED:  MARCAINE    and OTHER Ropivicaine  SPECIMEN:  Source of Specimen:  uterus; fallopian tubes and pedunculated fibroid  DISPOSITION OF SPECIMEN:  PATHOLOGY  COUNTS:  YES  TOURNIQUET:  * No tourniquets in log *  DICTATION: .Note written in EPIC  PLAN OF CARE: admit for extended observation then discharge to home  PATIENT DISPOSITION:  PACU - hemodynamically stable.   Delay start of Pharmacological VTE agent (>24hrs) due to surgical blood loss or risk of bleeding: yes

## 2012-07-15 ENCOUNTER — Encounter (HOSPITAL_COMMUNITY): Payer: Self-pay | Admitting: Obstetrics & Gynecology

## 2012-07-15 MED ORDER — OXYCODONE-ACETAMINOPHEN 5-325 MG PO TABS
1.0000 | ORAL_TABLET | ORAL | Status: DC | PRN
  Administered 2012-07-15: 2 via ORAL
  Filled 2012-07-15: qty 2

## 2012-07-15 NOTE — Progress Notes (Signed)
Discharge instructions provided to patient and family at bedside.  Medications, activity, follow up appointments, when to call the doctor and community resources discussed.  No questions at this time.  Patient left unit with all personal belongings in stable condition.  Osvaldo Angst, RN-------------------

## 2012-07-15 NOTE — Progress Notes (Signed)
Pt c/o dizziness and lightheadedness when ambulating. States she "feels too bad to go home."  Dr. Erin Fulling notifed.

## 2012-07-15 NOTE — Discharge Summary (Signed)
Physician Discharge Summary  Patient ID: Jill Meza MRN: [id number] DOB/AGE: 43-Mar-1972 43 y.o.  Admit date: 07/14/2012 Discharge date: 07/15/2012  Admission Diagnoses: menorrhagia; fibroids  Discharge Diagnoses: menorrhagia; fibroids Active Problems:   * No active hospital problems. *   Discharged Condition: good  Hospital Course: Pt was admitted for a laparoscopic assisted vaginal hysterectomy.  She stayed overnight due to excessive n/v and for pain control.  She reports that her nausea has resolved and she has eaten without difficulty.  She has good pain control on po pain meds and reports that she is ready to go home now.  Consults: None  Significant Diagnostic Studies: labs: cbc  Treatments: surgery: laparoscopic assisted vaginal hysterectomy  Discharge Exam: Blood pressure 104/63, pulse 75, temperature 98.8 F (37.1 C), temperature source Oral, resp. rate 16, height 5\' 3"  (1.6 m), weight 154 lb (69.854 kg), last menstrual period 06/23/2012, SpO2 100.00%. General appearance: alert and no distress GI: soft, non-tender; bowel sounds normal; no masses,  no organomegaly Incision/Wound:dressing dry on all 3 port sites  Disposition: 01-Home or Self Care  Discharge Orders   Future Appointments Provider Department Dept Phone   08/04/2012 1:45 PM Tereso Newcomer, MD [facility] [phone]   Future Orders Complete By Expires     Call MD for:  persistant dizziness or light-headedness  As directed     Call MD for:  persistant nausea and vomiting  As directed     Call MD for:  redness, tenderness, or signs of infection (pain, swelling, redness, odor or green/yellow discharge around incision site)  As directed     Call MD for:  severe uncontrolled pain  As directed     Call MD for:  temperature >100.4  As directed     Diet - low sodium heart healthy  As directed     Driving Restrictions  As directed     Comments:      No driving while on narcotic meds     Increase activity slowly  As directed     Lifting restrictions  As directed     Comments:      No lifting greater than 20#    Remove dressing in 48 hours  As directed     Sexual Activity Restrictions  As directed     Comments:      No sexual activity for 6 weeks        Medication List    STOP taking these medications       norgestimate-ethinyl estradiol 0.25-35 MG-MCG tablet  Commonly known as:  ORTHO-CYCLEN,SPRINTEC,PREVIFEM      TAKE these medications       ferrous sulfate 325 (65 FE) MG tablet  Take 325 mg by mouth 2 (two) times daily.     ibuprofen 600 MG tablet  Commonly known as:  ADVIL,MOTRIN  Take 1 tablet (600 mg total) by mouth every 6 (six) hours as needed for pain.     INTEGRA F PO  Take 1 capsule by mouth daily.     multivitamin with minerals tablet  Take 1 tablet by mouth daily.     oxyCODONE-acetaminophen 5-325 MG per tablet  Commonly known as:  PERCOCET/ROXICET  Take 1-2 tablets by mouth every 6 (six) hours as needed for pain.           Follow-up Information   Follow up with Willodean Rosenthal, MD In 2 weeks.   Contact information:   [address] [place] [address] [phone]  Doing well post op for discharge to home  Signed: HARRAWAY-SMITH, Davinia Riccardi 07/15/2012, 12:47 PM

## 2012-07-17 LAB — TYPE AND SCREEN
ABO/RH(D): O POS
Antibody Screen: NEGATIVE
Unit division: 0

## 2012-07-22 ENCOUNTER — Encounter: Payer: Self-pay | Admitting: *Deleted

## 2012-08-04 ENCOUNTER — Ambulatory Visit (INDEPENDENT_AMBULATORY_CARE_PROVIDER_SITE_OTHER): Payer: Self-pay | Admitting: Obstetrics & Gynecology

## 2012-08-04 ENCOUNTER — Encounter: Payer: Self-pay | Admitting: Obstetrics & Gynecology

## 2012-08-04 VITALS — BP 125/69 | HR 86 | Ht 63.0 in | Wt 151.9 lb

## 2012-08-04 DIAGNOSIS — Z09 Encounter for follow-up examination after completed treatment for conditions other than malignant neoplasm: Secondary | ICD-10-CM

## 2012-08-04 NOTE — Patient Instructions (Signed)
Return to clinic for any scheduled appointments or for any gynecologic concerns as needed.   

## 2012-08-04 NOTE — Progress Notes (Signed)
  Subjective:     Jill Meza is a 42 y.o. [phone] female who presents to the clinic status post  laparoscopic assisted vaginal hysterectomy and bilateral salpingectomy on 07/14/13 for fibroids.  Patient is Spanish-speaking only, Spanish interpreter present for this encounter.  Eating a regular diet without difficulty. Bowel movements are normal. The patient is not having any pain.  The following portions of the patient's history were reviewed and updated as appropriate: allergies, current medications, past family history, past medical history, past social history, past surgical history and problem list. Normal mammogram in 05/2012.  Review of Systems Pertinent items are noted in HPI.    Objective:    BP 125/69  Pulse 86  Ht 5\' 3"  (1.6 m)  Wt 151 lb 14.4 oz (68.901 kg)  BMI 26.91 kg/m2  LMP 06/23/2012 General:  alert and no distress  Abdomen: soft, bowel sounds active, non-tender  Incision: healing well, no drainage, no erythema,no dehiscence. Suture knots clipped as they were extruding from the incisions  Pelvic:   Small amount of brown discharge noted, sutures in place    07/14/12 Pathology  Uterus and bilateral fallopian tubes, with cervix and fibroid - CERVIX: BENIGN ENDOCERVICAL POLYP. - ENDOMETRIUM: PROLIFERATIVE. NO EVIDENCE OF HYPERPLASIA OR CARCINOMA. - MYOMETRIUM: UNREMARKABLE. - RIGHT AND LEFT FALLOPIAN TUBES: BENIGN PARATUBAL CYSTS. NO ENDOMETRIOSIS OR MALIGNANCY.  Assessment:    Doing well postoperatively. Operative findings again reviewed. Pathology report discussed.    Plan:   1. Continue any current medications. 2. Wound care discussed. 3. Activity restrictions: Pelvic rest for 8 weeks after hysterectomy 4. Follow up as needed or for annual exam

## 2013-04-10 ENCOUNTER — Other Ambulatory Visit: Payer: Self-pay | Admitting: Obstetrics and Gynecology

## 2013-04-10 DIAGNOSIS — Z1231 Encounter for screening mammogram for malignant neoplasm of breast: Secondary | ICD-10-CM

## 2013-05-12 ENCOUNTER — Encounter (HOSPITAL_COMMUNITY): Payer: Self-pay | Admitting: Emergency Medicine

## 2013-05-12 ENCOUNTER — Emergency Department (INDEPENDENT_AMBULATORY_CARE_PROVIDER_SITE_OTHER)
Admission: EM | Admit: 2013-05-12 | Discharge: 2013-05-12 | Disposition: A | Discharge status: Home / Self Care | Payer: Self-pay | Source: Home / Self Care | Attending: Family Medicine | Admitting: Family Medicine

## 2013-05-12 DIAGNOSIS — H8110 Benign paroxysmal vertigo, unspecified ear: Secondary | ICD-10-CM

## 2013-05-12 MED ORDER — METHYLPREDNISOLONE SODIUM SUCC 125 MG IJ SOLR
125.0000 mg | Freq: Once | INTRAMUSCULAR | Status: AC
  Administered 2013-05-12: 125 mg via INTRAMUSCULAR

## 2013-05-12 MED ORDER — MECLIZINE HCL 50 MG PO TABS: 50.0000 mg | ORAL_TABLET | Freq: Three times a day (TID) | ORAL | Status: DC | PRN

## 2013-05-12 MED ORDER — FLUTICASONE PROPIONATE 50 MCG/ACT NA SUSP: 1.0000 | Freq: Two times a day (BID) | NASAL | Status: DC

## 2013-05-12 MED ORDER — METHYLPREDNISOLONE SODIUM SUCC 125 MG IJ SOLR
INTRAMUSCULAR | Status: AC
  Filled 2013-05-12: qty 2

## 2013-05-12 NOTE — ED Provider Notes (Addendum)
CSN: [id number]     Arrival date & time 05/12/13  1214 History   First MD Initiated Contact with Patient 05/12/13 1331     Chief Complaint  Patient presents with  . Dizziness   (Consider location/radiation/quality/duration/timing/severity/associated sxs/prior Treatment) Patient is a 43 y.o. female presenting with dizziness. The history is provided by the patient.  Dizziness Quality:  Lightheadedness and imbalance Severity:  Mild Onset quality:  Gradual Duration:  2 weeks Progression:  Improving Chronicity:  New Context: bending over and head movement   Context: not with loss of consciousness   Worsened by:  Nothing tried Ineffective treatments:  None tried Associated symptoms: headaches   Associated symptoms: no blood in stool, no diarrhea, no nausea, no palpitations, no tinnitus, no vomiting and no weakness     Past Medical History  Diagnosis Date  . Anemia   . DUB (dysfunctional uterine bleeding)    Past Surgical History  Procedure Laterality Date  . No past surgeries    . Laparoscopic assisted vaginal hysterectomy N/A 07/14/2012    Procedure: LAPAROSCOPIC ASSISTED VAGINAL HYSTERECTOMY;  Surgeon: Lavonia Drafts, MD;  Location: [facility];  Service: Gynecology;  Laterality: N/A;  . Bilateral salpingectomy Bilateral 07/14/2012    Procedure: BILATERAL SALPINGECTOMY;  Surgeon: Lavonia Drafts, MD;  Location: [facility];  Service: Gynecology;  Laterality: Bilateral;   Family History  Problem Relation Age of Onset  . Hypertension Mother   . Heart attack Father    History  Substance Use Topics  . Smoking status: Never Smoker   . Smokeless tobacco: Never Used  . Alcohol Use: No   OB History   Grav Para Term Preterm Abortions TAB SAB Ect Mult Living   4 4 4       4      Review of Systems  Constitutional: Negative.   HENT: Negative for tinnitus.   Cardiovascular: Negative for palpitations.  Gastrointestinal: Negative for nausea, vomiting, diarrhea and blood in stool.   Neurological: Positive for dizziness, light-headedness and headaches.    Allergies  Review of patient's allergies indicates no known allergies.  Home Medications   Current Outpatient Rx  Name  Route  Sig  Dispense  Refill  . Fe Fum-FePoly-FA-Vit C-Vit B3 (INTEGRA F PO)   Oral   Take 1 capsule by mouth daily.         . ferrous sulfate 325 (65 FE) MG tablet   Oral   Take 325 mg by mouth 2 (two) times daily.         . fluticasone (FLONASE) 50 MCG/ACT nasal spray   Each Nare   Place 1 spray into both nostrils 2 (two) times daily.   1 g   2   . ibuprofen (ADVIL,MOTRIN) 600 MG tablet   Oral   Take 1 tablet (600 mg total) by mouth every 6 (six) hours as needed for pain.   30 tablet   1   . meclizine (ANTIVERT) 50 MG tablet   Oral   Take 1 tablet (50 mg total) by mouth 3 (three) times daily as needed. For dizziness   30 tablet   0   . Multiple Vitamins-Minerals (MULTIVITAMIN WITH MINERALS) tablet   Oral   Take 1 tablet by mouth daily.         Marland Kitchen oxyCODONE-acetaminophen (PERCOCET/ROXICET) 5-325 MG per tablet   Oral   Take 1-2 tablets by mouth every 6 (six) hours as needed for pain.   30 tablet   0    BP 133/60  Pulse 83  Temp(Src) 99.1 F (37.3 C) (Oral)  Resp 16  SpO2 97%  LMP 06/23/2012 Physical Exam  Nursing note and vitals reviewed. Constitutional: She is oriented to person, place, and time. She appears well-developed and well-nourished.  HENT:  Head: Normocephalic.  Right Ear: External ear normal.  Left Ear: External ear normal.  Mouth/Throat: Oropharynx is clear and moist.  Eyes: Conjunctivae and EOM are normal. Pupils are equal, round, and reactive to light.  Neck: Normal range of motion. Neck supple.  Cardiovascular: Normal heart sounds.   Pulmonary/Chest: Breath sounds normal.  Lymphadenopathy:    She has no cervical adenopathy.  Neurological: She is alert and oriented to person, place, and time. She has normal strength. No cranial nerve  deficit. She displays a negative Romberg sign. Coordination and gait normal.  Skin: Skin is warm and dry.  Psychiatric: She has a normal mood and affect. Her behavior is normal.    ED Course  Procedures (including critical care time) Labs Review Labs Reviewed - No data to display Imaging Review No results found.   MDM   1. BPV (benign positional vertigo)        Billy Fischer, MD 05/12/13 1611  Billy Fischer, MD 05/12/13 [phone]

## 2013-05-12 NOTE — ED Notes (Signed)
Extensive teaching

## 2013-05-12 NOTE — ED Notes (Signed)
Patient is very anxious.  C/o feeling unsteady , feels like floor is moving.  Onset 3/24.  Seen at a [facility] in [place].  Does not know diagnosis

## 2013-05-16 ENCOUNTER — Ambulatory Visit (HOSPITAL_COMMUNITY)
Admission: RE | Admit: 2013-05-16 | Discharge: 2013-05-16 | Disposition: A | Discharge status: Home / Self Care | Payer: Self-pay | Source: Ambulatory Visit | Attending: Obstetrics and Gynecology | Admitting: Obstetrics and Gynecology

## 2013-05-16 ENCOUNTER — Encounter (HOSPITAL_COMMUNITY): Payer: Self-pay

## 2013-05-16 VITALS — BP 110/60 | Temp 98.6°F | Ht 65.0 in | Wt 160.2 lb

## 2013-05-16 DIAGNOSIS — Z1239 Encounter for other screening for malignant neoplasm of breast: Secondary | ICD-10-CM

## 2013-05-16 DIAGNOSIS — Z1231 Encounter for screening mammogram for malignant neoplasm of breast: Secondary | ICD-10-CM

## 2013-05-16 NOTE — Patient Instructions (Signed)
[place] how to perform BSE and gave educational materials to take home. Patient did not need a Pap smear today due to last Pap smear was 03/14/2012. Patient has a history of a hysterectomy 07/14/2012 for benign reasons. Told patient that she no longer needs Pap smears due to her history of a hysterectomy for benign reasons and no history of abnormal Pap smears.  Let [facility] know that [facility] will [facility] with her within the next couple weeks with results by letter or phone. Jill Meza verbalized understanding. Patient escorted to mammography for a screening mammogram.   Thaddeaus Monica, Arvil Chaco, RN 10:23 AM

## 2013-05-16 NOTE — Progress Notes (Signed)
No complaints today.   Pap Smear: Pap smear not completed today. Last Pap smear was 03/14/2012 at the free cervical cancer screening at the [facility] and normal. Per patient no history of abnormal Pap smears. Patient has a history of a hysterectomy 07/14/2012 due to fibroids. Pap smear result is scanned in EPIC under media.   Physical exam:  Breasts  Breasts symmetrical. No skin abnormalities bilateral breasts. No nipple retraction bilateral breasts. No nipple discharge bilateral breasts. No lymphadenopathy. No lumps palpated bilateral breasts. No complaints of pain or tenderness on exam. Patient escorted to mammography for a screening mammogram.   Pelvic/Bimanual  No Pap smear completed today since last Pap smear was 03/14/2012. Pap smear not indicated per BCCCP guidelines.

## 2013-05-26 ENCOUNTER — Ambulatory Visit: Payer: Self-pay

## 2013-05-26 ENCOUNTER — Other Ambulatory Visit: Payer: Self-pay

## 2013-06-09 ENCOUNTER — Ambulatory Visit: Payer: Self-pay

## 2013-12-11 ENCOUNTER — Encounter (HOSPITAL_COMMUNITY): Payer: Self-pay

## 2014-02-23 ENCOUNTER — Emergency Department (HOSPITAL_COMMUNITY): Payer: Self-pay

## 2014-02-23 ENCOUNTER — Emergency Department (INDEPENDENT_AMBULATORY_CARE_PROVIDER_SITE_OTHER)
Admission: EM | Admit: 2014-02-23 | Discharge: 2014-02-23 | Disposition: A | Discharge status: Home / Self Care | Payer: Self-pay | Source: Home / Self Care | Attending: Emergency Medicine | Admitting: Emergency Medicine

## 2014-02-23 ENCOUNTER — Encounter (HOSPITAL_COMMUNITY): Payer: Self-pay

## 2014-02-23 DIAGNOSIS — M25561 Pain in right knee: Secondary | ICD-10-CM

## 2014-02-23 DIAGNOSIS — R609 Edema, unspecified: Secondary | ICD-10-CM

## 2014-02-23 MED ORDER — NAPROXEN 375 MG PO TABS: 375.0000 mg | ORAL_TABLET | Freq: Two times a day (BID) | ORAL | Status: DC

## 2014-02-23 NOTE — ED Notes (Signed)
Knee pain. provider eval only

## 2014-02-23 NOTE — ED Provider Notes (Signed)
CSN: [id number]     Arrival date & time 02/23/14  1232 History   First MD Initiated Contact with Patient 02/23/14 1348     Chief Complaint  Patient presents with  . Knee Pain   (Consider location/radiation/quality/duration/timing/severity/associated sxs/prior Treatment) HPI Comments: Patient states she began going to the gym with her son's about 4-5 weeks ago and since that time she has some mild discomfort and occasional swelling of her right knee. States this occurs following exercise or after a day of increased activity at home. States that in the morning before her day begins she always feels fine. Cannot recall specific injury. Has not taken any medication for her discomfort. No previous injury or surgery. No additional joint swelling of fevers. States she often attends impact aerobic classes of dance/Zumba classes.   Patient is a 44 y.o. female presenting with knee pain. The history is provided by the patient.  Knee Pain Location:  Knee Time since incident: sx began 4-5 weeks ago. Lower extremity injury: unknown.   Knee location:  R knee Pain details:    Quality:  Aching   Radiates to:  Does not radiate   Severity:  Mild   Past Medical History  Diagnosis Date  . Anemia   . DUB (dysfunctional uterine bleeding)    Past Surgical History  Procedure Laterality Date  . No past surgeries    . Laparoscopic assisted vaginal hysterectomy N/A 07/14/2012    Procedure: LAPAROSCOPIC ASSISTED VAGINAL HYSTERECTOMY;  Surgeon: Lavonia Drafts, MD;  Location: [facility];  Service: Gynecology;  Laterality: N/A;  . Bilateral salpingectomy Bilateral 07/14/2012    Procedure: BILATERAL SALPINGECTOMY;  Surgeon: Lavonia Drafts, MD;  Location: [facility];  Service: Gynecology;  Laterality: Bilateral;  . Abdominal hysterectomy  07/14/2012   Family History  Problem Relation Age of Onset  . Hypertension Mother   . Heart attack Father    History  Substance Use Topics  . Smoking status: Never  Smoker   . Smokeless tobacco: Never Used  . Alcohol Use: No   OB History    Gravida Para Term Preterm AB TAB SAB Ectopic Multiple Living   4 4 4       4      Review of Systems  All other systems reviewed and are negative.   Allergies  Review of patient's allergies indicates no known allergies.  Home Medications   Prior to Admission medications   Medication Sig Start Date End Date Taking? Authorizing Provider  Fe Fum-FePoly-FA-Vit C-Vit B3 (INTEGRA F PO) Take 1 capsule by mouth daily.    Historical Provider, MD  ferrous sulfate 325 (65 FE) MG tablet Take 325 mg by mouth 2 (two) times daily.    Historical Provider, MD  fluticasone (FLONASE) 50 MCG/ACT nasal spray Place 1 spray into both nostrils 2 (two) times daily. 05/12/13   Billy Fischer, MD  ibuprofen (ADVIL,MOTRIN) 600 MG tablet Take 1 tablet (600 mg total) by mouth every 6 (six) hours as needed for pain. 07/14/12   Lavonia Drafts, MD  meclizine (ANTIVERT) 50 MG tablet Take 1 tablet (50 mg total) by mouth 3 (three) times daily as needed. For dizziness 05/12/13   Billy Fischer, MD  Multiple Vitamins-Minerals (MULTIVITAMIN WITH MINERALS) tablet Take 1 tablet by mouth daily.    Historical Provider, MD  oxyCODONE-acetaminophen (PERCOCET/ROXICET) 5-325 MG per tablet Take 1-2 tablets by mouth every 6 (six) hours as needed for pain. 07/14/12   Lavonia Drafts, MD   BP 119/63 mmHg  Pulse  74  Temp(Src) 99 F (37.2 C) (Oral)  Resp 14  SpO2 99%  LMP 06/23/2012 Physical Exam  Constitutional: She is oriented to person, place, and time. She appears well-developed and well-nourished. No distress.  HENT:  Head: Normocephalic and atraumatic.  Eyes: Conjunctivae are normal.  Cardiovascular: Normal rate.   Pulmonary/Chest: Effort normal.  Musculoskeletal: Normal range of motion.       Right knee: She exhibits effusion. She exhibits normal range of motion, no swelling, no ecchymosis, no deformity, no laceration, no erythema, normal  alignment, no LCL laxity, normal patellar mobility, no bony tenderness, normal meniscus and no MCL laxity. No tenderness found. No medial joint line, no lateral joint line, no MCL, no LCL and no patellar tendon tenderness noted.       Legs: Neurological: She is alert and oriented to person, place, and time.  Skin: Skin is warm and dry. No rash noted. No erythema.  Psychiatric: She has a normal mood and affect. Her behavior is normal.  Nursing note and vitals reviewed.   ED Course  Procedures (including critical care time) Labs Review Labs Reviewed - No data to display  Imaging Review No results found.   MDM   1. Swelling   Suspect overuse joint inflammation from recent return to physical exercise. I suggested ice, elevation and ibuprofen or naprosyn for her symptoms and transitioning to forms of lower impact exercise. If no improvements with about, advised to follow up with orthopedics for further evaluation.     Lutricia Feil, [place] 02/23/14 1455

## 2014-02-23 NOTE — Discharge Instructions (Signed)
Dolor en la rodilla (Knee Pain) La rodilla es la articulacin compleja entre el muslo y la parte inferior de la pierna. En esta articulacin hay huesos, tendones, ligamentos y Database administrator. Los huesos que forman la rodilla son:  El fmur en el muslo.  La tibia y el peron en la pierna.  La rtula montada en la ranura de la parte inferior del muslo. [place] es una causa frecuente de [place] y puede tener varias causas. Algunas son:  Lesiones como:  Ruptura de ligamento o lesin en el tendn.  Esguince del cartlago  Enfermedades como:  Gota.  Artritis.  Infecciones.  Uso excesivo, demasiado entrenamiento o mucha actividad fsica. El dolor de rodilla puede ser leve o intenso. Puede acompaar una lesin debilitante. Los problemas leves con frecuencia responden bien a tratamientos caseros o se mejoran por s mismas. Las lesiones ms graves pueden requerir la intervencin del mdico y Rennis Petty. SNTOMAS La rodilla es una articulacin compleja. Los sntomas pueden variar ampliamente Algunos son:  Dolor con el movimiento o al soportar peso.  Hinchazn y Social research officer, government.  Torsin de la rodilla.  Imposibilidad para estirar la rodilla.  La rodilla se traba y no puede enderezarla.  Siente calor y se observa enrojecimiento con dolor y [place].  Deformidad o dislocacin de la rtula. DIAGNSTICO Determinar cual es el problema puede ser bastante simple, como cuando hay una lesin. Tambin puede ser [employer] debido a la complejidad de la rodilla. Las pruebas para Optometrist un diagnstico son:  Shirleen Schirmer y examen fsico por parte del mdico.  Radiografas para descartar otros problemas. Las radiografas no mostrarn la ruptura del Database administrator. Algunas lesiones en la rodilla pueden diagnosticarse del siguiente modo:  La artroscopia es una tcnica quirrgica por la que una pequea cmara de vdeo se inserta en pequeas incisiones que se hacen a los lados de la  rodilla. Este procedimiento se [place] para examinar y Psychiatric nurse los problemas de la articulacin interna de la rodilla. Se utilizan pequeos instrumentos para reparar el cartlago roto (meniscos).  La artrografa es una tcnica radiolgica. Se inyecta un lquido de contraste en la articulacin de la rodilla. Las estructuras internas de la articulacin de la rodilla se hacen visibles en una pelcula de rayos X.  Las imgenes por resonancia magntica son un procedimiento en el que los campos magnticos y una computadora producen imgenes en dos o tres dimensiones del interior de la rodilla. La ruptura del cartlago es visible con esta tcnica. La resonancia magntica ha reemplazado a la artrografa en el diagnstico de la ruptura del cartlago de la rodilla.  Anlisis de [place].  Examen del lquido que lubrica la articulacin de la rodilla (lquido sinovial). Se realiza tomando [place] con [place]. TRATAMIENTO El tratamiento de los problemas de la rodilla depende fundamentalmente de la causa. Algunos de estos tratamientos son:  Segn sea la lesin, un yeso o entablillado, ciruga o fisioterapia.  Permtase el tiempo adecuado de recuperacin. No use demasiado su extremidad lesionada. Si siente dolor durante los ejercicios de rutina, suspndalos. Hgalos ms lentos o realice menos repeticiones.  En el caso de actividades repetitivas como andar en bicicleta o correr, mantenga la fuerza y Ardelia Mems buena nutricin.  Alterne los grupos musculares. Por ejemplo, si levanta pesas, trabaje la parte superior del cuerpo Optician, dispensing, y la parte inferior al da siguiente.  Ni los msculos firmes ni los dbiles proporcionan un sostn adecuado a la rodilla. Los msculos no absorben el estrs que  se ejerce sobre la articulacin de la rodilla. Mantenga fuertes los msculos que rodean a la rodilla.  Cudese de los problemas mecnicos:  Si tiene pie plano, los zapatos ortopdicos o especiales pueden ayudar.  Comunquese con el profesional que lo asiste si necesita ayuda adicional.  Los soporte de arco con bordes en la zona interna o interna del taco pueden ayudar. Cambian la presin del lado de la rodilla ms comprometido por la osteoartritis.  Podrn colocarle una ortesis de rodilla para aliviar la presin en la zona ms artrtica de la rodilla.  Si el profesional le ha prescripto muletas, ortesis, un vendaje o hielo, hgalo segn las indicaciones. El acrnimo para este tratamiento es PRICE. Significa proteccin, reposo, hielo, compresin y elevacin.  Los antiinflamatorios no esteroides, pueden ayudar a Best boy. Pero si se toman inmediatamente luego de la lesin, podran aumentar la hinchazn. Tome los corticoides luego de Soil scientist. Suspndalos si tiene problemas estomacales. No los tome si tiene una historia de Aeronautical engineer, Social research officer, government en el estmago o hemorragia intestinal. No lo tome sin la aprobacin del profesional que la asiste si tiene problemas de retencin de lquidos, insuficencia cardaca o problemas renales.  En los casos crnicos, la fisioterapia puede ser de [place].  La glucosamina y el condroitin son suplementos dietarios de Radio broadcast assistant. Ambos pueden Best boy de la osteoartritis de la rodilla. Estos medicamentos son diferentes de los antiinflamatorios habituales. La glucosamina puede disminuir el porcentaje de destruccin del cartlago.  Las inyecciones de corticoides en la articulacin de la rodilla reducen los sntomas de un brote de artritis. Ofrecen alivio que dura algunos meses. Hay que esperar algunos meses entre la aplicacin de inyecciones. Las inyecciones tiene un pequeo riesgo de infeccin, retencin de lquidos y Agricultural consultant de los niveles de Museum/gallery exhibitions officer.  El cido hialurnico inyectado en las articulaciones lesionadas puede aliviar el dolor y proporciona lubricacin. Estas inyecciones funcionan bien reduciendo la inflamacin. Una serie de inyecciones puede  proporcionar alivio durante seis meses.  [place]. Aplicar ciertos ungentos sobre la piel puede ayudar a Best boy y la rigidez de la osteoartritis. Consulte con el farmacutico, si es necesario. Muchos medicamentos de venta libre estn aprobados para el alivio temporario del dolor artrtico.  En algunos pases los mdicos prescriben antiinflamatorios no esteroides para el alivio de los trastornos crnicos como la artritis y la tendinitis. Un estudio del tratamiento con antiinflamatorios no esteroides aplicados en crema, demostr que funcionaban bien, as como administrados por va oral, pero sin el peligro de los [place]. PREVENCIN  Mantenga un peso normal. Los kilos de ms agregan tensin a las articulaciones.  Mantngase fuerte y gil. Los msculos dbiles son Ardelia Mems causa frecuente de lesiones en la rodilla. La elongacin es importante. Incluya ejercicios de flexibilidad en sus rutinas.  Practique actividad fsica con inteligencia. Si sufre osteoartritis, dolor crnico en la rodilla o lesiones recurrentes, podr ser necesario que modifique el modo en que se ejercita. No significa que deba volverse inactivo. Si le duelen las rodillas despus de correr o jugar basketball, considere la prctica de la natacin, ejercicios aerbicos en el agua u otras actividades de bajo [place], al menos durante algunos das o [employer]. En algunos casos, el [employer] actividades de alto impacto ofrece [place].  Asegrese que sus zapatos le [place]. Elija el calzado deportivo adecuado para su deporte.  Proteja sus rodillas. Use la proteccin adecuada para las actividades que puedan afectar a sus rodillas. Use rodilleras cuando juegue al  vley o se arrodille. Colquese el cinturn de seguridad cada vez que conduzca. La mayor parte de las fracturas de rtula ocurren en accidentes automvilsticos.  Descanse cuando se sienta cansado. SOLICITE ATENCIN MDICA SI: Tiene dolor en la  rodilla que es continuo y no parece mejorar.  SOLICITE ATENCIN MDICA DE INMEDIATO SI:  La articulacin de la rodilla se siente caliente al tacto y usted tiene fiebre. EST SEGURO QUE:   Comprende las instrucciones para el alta mdica.  Controlar su enfermedad.  Solicitar atencin mdica de inmediato segn las indicaciones. Document Released: 07/15/2007 Document Revised: 04/20/2011 [facility] Patient Information 2015 [place]. This information is not intended to replace advice given to you by your health care provider. Make sure you discuss any questions you have with your health care provider.

## 2014-03-13 ENCOUNTER — Encounter: Payer: Self-pay | Admitting: Sports Medicine

## 2014-03-13 ENCOUNTER — Ambulatory Visit (INDEPENDENT_AMBULATORY_CARE_PROVIDER_SITE_OTHER): Payer: Self-pay | Admitting: Sports Medicine

## 2014-03-13 VITALS — BP 107/72 | HR 67 | Ht 63.0 in | Wt 165.0 lb

## 2014-03-13 DIAGNOSIS — M25561 Pain in right knee: Secondary | ICD-10-CM

## 2014-03-13 MED ORDER — METHYLPREDNISOLONE ACETATE 40 MG/ML IJ SUSP
40.0000 mg | Freq: Once | INTRAMUSCULAR | Status: AC
  Administered 2014-03-13: 40 mg via INTRA_ARTICULAR

## 2014-03-13 NOTE — Patient Instructions (Addendum)
Pt interpreter for visit is Graceila.  Your knee pain is most likely due to a tear in your lateral meniscus. -We performed a steroid injection into the right knee today -Relative rest, you may exercise, but avoid keep squatting or deep bending -Ice -Elevate -Ibuprofen if needed -A compression sleeve may help as well -Plan follow-up in 4 weeks to reassess, consider MRI if not improving.

## 2014-03-13 NOTE — Assessment & Plan Note (Addendum)
[place] with hypoechoic lateral meniscus with slight bulging. Likely due to lateral meniscus tear.  -Corticosteroid injection today -Rest, ice, elevate, therapeutic exercises, avoid deep lunges or squats -Anti-inflammatories if needed -Follow-up in 4 weeks for reevaluation or sooner if needed. If pain persists consider MRI of the right knee.

## 2014-03-13 NOTE — Progress Notes (Signed)
   Subjective:    Patient ID: Jill Meza, female    DOB: 19-Jan-1971, 44 y.o.   MRN: [id number]  HPI  The patient is a 44 year old female who presents with right knee pain. Onset was approximately 2-3 months ago without any known acute injury. She does recall that it started soon after she got done with her Zumba class. She is primarily Spanish-speaking and a Spanish language interpreter was used. Location of pain is primarily anterior lateral right knee pain. Symptoms are aggravated with standing from a seated position, activities such as walking or exercising. She says occasionally she notices some knee swelling and feels that her knee pops with associated pain. Twisting aggravates her symptoms. She denies any true locking. No overlying erythema or induration, fevers, or chills. She tried taking ibuprofen, ice, and elevation with little relief over the past 2 months.  Past medical history, social history, medications, and allergies were reviewed and are up to date in the chart.  Review of Systems 7 point review of systems was performed and was otherwise negative unless noted in the history of present illness.     Objective:   Physical Exam BP 107/72 mmHg  Pulse 67  Ht 5\' 3"  (1.6 m)  Wt 165 lb (74.844 kg)  BMI 29.24 kg/m2  LMP 06/23/2012 GEN: The patient is well-developed well-nourished female and in no acute distress.  She is awake alert and oriented x3. SKIN: warm and well-perfused, no rash  EXTR: No lower extremity edema or calf tenderness Neuro: Strength 5/5 globally. Sensation intact throughout. No focal deficits. Vasc: +2 bilateral distal pulses. No edema.  MSK: Examination of the right knee reveals range of motion from 0-120 with minimal pain. She has tenderness to palpation along the anterior lateral joint line. No swelling or induration. Negative McMurray's. Negative patellar grind test. No laxity with valgus or varus testing. Negative Lachman's. No tenderness  along the iliotibial band. No tenderness at the patellar tendon or its insertion.  Limited musculoskeletal ultrasound: Long and short axis views were obtained of the right knee which show a slightly increased fluid in the suprapatellar pouch. The medial meniscus appears normal. The quadriceps and patellar tendons appear normal. There appears to be some hypoechoic change with slight calcification of the lateral meniscus with slight bulging. The iliotibial band appears normal.  Procedure:  Risks, benefits and complications were reviewed with the patient. After obtaining informed verbal consent the right knee was sterilely prepped with alcohol.  Ethyl chloride was used to anesthetize the skin and intra-articular injection using a 25-gauge by 1-1/2 inch needle was accomplished without difficulty.  We injected 40mg  methylprednisone with 4 cc 1% lidocaine without epinephrine.  Patient tolerated the procedure well and was observed for 5-10 min. Post injection instructions, including signs and symptoms of complications were discussed.    Assessment & Plan:  Please see problem based assessment and plan in the problem list.

## 2014-03-20 ENCOUNTER — Ambulatory Visit: Payer: Self-pay | Attending: Internal Medicine | Admitting: Internal Medicine

## 2014-03-20 ENCOUNTER — Encounter: Payer: Self-pay | Admitting: Internal Medicine

## 2014-03-20 VITALS — BP 110/75 | HR 86 | Temp 98.0°F | Resp 16 | Wt 155.0 lb

## 2014-03-20 DIAGNOSIS — Z7951 Long term (current) use of inhaled steroids: Secondary | ICD-10-CM | POA: Insufficient documentation

## 2014-03-20 DIAGNOSIS — Z90710 Acquired absence of both cervix and uterus: Secondary | ICD-10-CM | POA: Insufficient documentation

## 2014-03-20 DIAGNOSIS — M25561 Pain in right knee: Secondary | ICD-10-CM | POA: Insufficient documentation

## 2014-03-20 DIAGNOSIS — Z0000 Encounter for general adult medical examination without abnormal findings: Secondary | ICD-10-CM | POA: Insufficient documentation

## 2014-03-20 DIAGNOSIS — H547 Unspecified visual loss: Secondary | ICD-10-CM

## 2014-03-20 DIAGNOSIS — D649 Anemia, unspecified: Secondary | ICD-10-CM | POA: Insufficient documentation

## 2014-03-20 LAB — COMPLETE METABOLIC PANEL WITH GFR
ALT: 13 U/L (ref 0–35)
AST: 14 U/L (ref 0–37)
Albumin: 4.1 g/dL (ref 3.5–5.2)
Alkaline Phosphatase: 59 U/L (ref 39–117)
BUN: 17 mg/dL (ref 6–23)
CALCIUM: 9 mg/dL (ref 8.4–10.5)
CHLORIDE: 106 meq/L (ref 96–112)
CO2: 27 meq/L (ref 19–32)
CREATININE: 0.55 mg/dL (ref 0.50–1.10)
GFR, Est Non African American: >89 mL/min
GLUCOSE: 102 mg/dL — AB (ref 70–99)
Potassium: 4.4 mEq/L (ref 3.5–5.3)
Sodium: 137 mEq/L (ref 135–145)
Total Bilirubin: 0.3 mg/dL (ref 0.2–1.2)
Total Protein: 6.8 g/dL (ref 6.0–8.3)

## 2014-03-20 NOTE — Progress Notes (Signed)
Patient Demographics  Jill Meza, is a 44 y.o. female  DUK:[id number]  BJS:[id number]  DOB - 11-Jun-1970  CC:  Chief Complaint  Patient presents with  . Establish Care       HPI: Jill Meza is a 44 y.o. female here today to establish medical care and is requesting annual physical examination.patient has history of hysterectomy secondary to heavy menstrual bleeding, currently patient denies any acute symptoms, she is following up with orthopedics has history of right knee pain as per patient she was given a steroid injection and reports improvement in the symptoms. Patient has No headache, No chest pain, No abdominal pain - No Nausea, No new weakness tingling or numbness, No Cough - SOB.  No Known Allergies Past Medical History  Diagnosis Date  . Anemia   . DUB (dysfunctional uterine bleeding)    Current Outpatient Prescriptions on File Prior to Visit  Medication Sig Dispense Refill  . Fe Fum-FePoly-FA-Vit C-Vit B3 (INTEGRA F PO) Take 1 capsule by mouth daily.    . ferrous sulfate 325 (65 FE) MG tablet Take 325 mg by mouth 2 (two) times daily.    . fluticasone (FLONASE) 50 MCG/ACT nasal spray Place 1 spray into both nostrils 2 (two) times daily. 1 g 2  . meclizine (ANTIVERT) 50 MG tablet Take 1 tablet (50 mg total) by mouth 3 (three) times daily as needed. For dizziness 30 tablet 0  . Multiple Vitamins-Minerals (MULTIVITAMIN WITH MINERALS) tablet Take 1 tablet by mouth daily.    . naproxen (NAPROSYN) 375 MG tablet Take 1 tablet (375 mg total) by mouth 2 (two) times daily with a meal. As needed for knee pain and swelling 20 tablet 0  . oxyCODONE-acetaminophen (PERCOCET/ROXICET) 5-325 MG per tablet Take 1-2 tablets by mouth every 6 (six) hours as needed for pain. 30 tablet 0   No current facility-administered medications on file prior to visit.   Family History  Problem Relation Age of Onset  . Hypertension Mother   . Heart attack Father    History    Social History  . Marital Status: Married    Spouse Name: N/A    Number of Children: N/A  . Years of Education: N/A   Occupational History  . Not on file.   Social History Main Topics  . Smoking status: Never Smoker   . Smokeless tobacco: Never Used  . Alcohol Use: No  . Drug Use: No  . Sexual Activity: Yes    Birth Control/ Protection: Surgical     Comment: vasecotomy   Other Topics Concern  . Not on file   Social History Narrative    Review of Systems: Constitutional: Negative for fever, chills, diaphoresis, activity change, appetite change and fatigue. HENT: Negative for ear pain, nosebleeds, congestion, facial swelling, rhinorrhea, neck pain, neck stiffness and ear discharge.  Eyes: Negative for pain, discharge, redness, itching and visual disturbance. Respiratory: Negative for cough, choking, chest tightness, shortness of breath, wheezing and stridor.  Cardiovascular: Negative for chest pain, palpitations and leg swelling. Gastrointestinal: Negative for abdominal distention. Genitourinary: Negative for dysuria, urgency, frequency, hematuria, flank pain, decreased urine volume, difficulty urinating and dyspareunia.  Musculoskeletal: Negative for back pain, joint swelling, arthralgia and gait problem. Neurological: Negative for dizziness, tremors, seizures, syncope, facial asymmetry, speech difficulty, weakness, light-headedness, numbness and headaches.  Hematological: Negative for adenopathy. Does not bruise/bleed easily. Psychiatric/Behavioral: Negative for hallucinations, behavioral problems, confusion, dysphoric mood, decreased concentration and agitation.    Objective:   Filed Vitals:  03/20/14 1617  BP: 110/75  Pulse: 86  Temp: 98 F (36.7 C)  Resp: 16    Physical Exam: Constitutional: Patient appears well-developed and well-nourished. No distress. HENT: Normocephalic, atraumatic, External right and left ear normal. Oropharynx is clear and moist.    Eyes: Conjunctivae and EOM are normal. PERRLA, no scleral icterus. Neck: Normal ROM. Neck supple. No JVD. No tracheal deviation. No thyromegaly. CVS: RRR, S1/S2 +, no murmurs, no gallops, no carotid bruit.  Pulmonary: Effort and breath sounds normal, no stridor, rhonchi, wheezes, rales.  Abdominal: Soft. BS +, no distension, tenderness, rebound or guarding.  Musculoskeletal: Normal range of motion. No edema and no tenderness.  Neuro: Alert. Normal reflexes, muscle tone coordination. No cranial nerve deficit. Skin: Skin is warm and dry. No rash noted. Not diaphoretic. No erythema. No pallor. Psychiatric: Normal mood and affect. Behavior, judgment, thought content normal.  Lab Results  Component Value Date   WBC 5.3 07/13/2012   HGB 10.5* 07/13/2012   HCT 33.8* 07/13/2012   MCV 93.4 07/13/2012   PLT 326 07/13/2012   Lab Results  Component Value Date   CREATININE 0.56 06/28/2007   BUN 12 06/28/2007   NA 141 06/28/2007   K 4.3 06/28/2007   CL 108 06/28/2007   CO2 23 06/28/2007    No results found for: HGBA1C Lipid Panel     Component Value Date/Time   CHOL 148 06/28/2007 2050   TRIG 39 06/28/2007 2050   HDL 58 06/28/2007 2050   CHOLHDL 2.6 Ratio 06/28/2007 2050   VLDL 8 06/28/2007 2050   LDLCALC 82 06/28/2007 2050       Assessment and plan:   1. Annual physical exam Ordered baseline blood work. - CBC with Differential/Platelet - COMPLETE METABOLIC PANEL WITH GFR - TSH - Vit D  25 hydroxy (rtn osteoporosis monitoring) - Hemoglobin A1c  2. Right knee pain Currently following up with Ortho      Health Maintenance  -Pap Smear: patient is s/p hystrectomy  -Mammogram: uptodate  -Vaccinations:  uptodate with flu shot   Return in about 4 months (around 07/19/2014), or if symptoms worsen or fail to improve.   Lorayne Marek, MD

## 2014-03-20 NOTE — Progress Notes (Signed)
Patient here for physical and to establish care Currently taking no prescribed medications Had flu vaccine at her church this past december

## 2014-03-21 ENCOUNTER — Telehealth: Payer: Self-pay | Admitting: *Deleted

## 2014-03-21 LAB — VITAMIN D 25 HYDROXY (VIT D DEFICIENCY, FRACTURES): Vit D, 25-Hydroxy: 29 ng/mL — ABNORMAL LOW (ref 30–100)

## 2014-03-21 LAB — CBC WITH DIFFERENTIAL/PLATELET
BASOS PCT: 0 % (ref 0–1)
Basophils Absolute: 0 10*3/uL (ref 0.0–0.1)
EOS ABS: 0.1 10*3/uL (ref 0.0–0.7)
EOS PCT: 2 % (ref 0–5)
HCT: 39.5 % (ref 36.0–46.0)
Hemoglobin: 13.1 g/dL (ref 12.0–15.0)
LYMPHS ABS: 2.8 10*3/uL (ref 0.7–4.0)
Lymphocytes Relative: 39 % (ref 12–46)
MCH: 31 pg (ref 26.0–34.0)
MCHC: 33.2 g/dL (ref 30.0–36.0)
MCV: 93.6 fL (ref 78.0–100.0)
MONOS PCT: 7 % (ref 3–12)
MPV: 10.2 fL (ref 8.6–12.4)
Monocytes Absolute: 0.5 10*3/uL (ref 0.1–1.0)
Neutro Abs: 3.7 10*3/uL (ref 1.7–7.7)
Neutrophils Relative %: 52 % (ref 43–77)
PLATELETS: 287 10*3/uL (ref 150–400)
RBC: 4.22 MIL/uL (ref 3.87–5.11)
RDW: 13 % (ref 11.5–15.5)
WBC: 7.1 10*3/uL (ref 4.0–10.5)

## 2014-03-21 LAB — TSH: TSH: 2.926 u[IU]/mL (ref 0.350–4.500)

## 2014-03-21 LAB — HEMOGLOBIN A1C
HEMOGLOBIN A1C: 5.4 % (ref <5.7)
Mean Plasma Glucose: 108 mg/dL (ref <117)

## 2014-03-21 NOTE — Telephone Encounter (Signed)
-----   Message from Lorayne Marek, MD sent at 03/21/2014  9:14 AM EST ----- Blood work reviewed, noticed low vitamin D, advise patient to start taking OTC 2000 units daily.

## 2014-03-21 NOTE — Telephone Encounter (Signed)
LVM to return call (Message left in Spanish)

## 2014-03-22 NOTE — Telephone Encounter (Signed)
Pt aware of lab results advised to take OTC Vit D 2000 Unit (Information was given In Spanish)

## 2014-04-10 ENCOUNTER — Ambulatory Visit: Payer: Self-pay | Admitting: Sports Medicine

## 2014-04-17 ENCOUNTER — Ambulatory Visit (INDEPENDENT_AMBULATORY_CARE_PROVIDER_SITE_OTHER): Payer: Self-pay | Admitting: Sports Medicine

## 2014-04-17 ENCOUNTER — Encounter: Payer: Self-pay | Admitting: Sports Medicine

## 2014-04-17 VITALS — BP 143/64 | Ht 63.0 in | Wt 165.0 lb

## 2014-04-17 DIAGNOSIS — M25561 Pain in right knee: Secondary | ICD-10-CM

## 2014-04-17 DIAGNOSIS — M76891 Other specified enthesopathies of right lower limb, excluding foot: Secondary | ICD-10-CM | POA: Insufficient documentation

## 2014-04-17 DIAGNOSIS — M65851 Other synovitis and tenosynovitis, right thigh: Secondary | ICD-10-CM

## 2014-04-17 NOTE — Progress Notes (Signed)
   Subjective:    Patient ID: Jill Meza, female    DOB: 1970-02-24, 44 y.o.   MRN: [id number]  HPI Jill Meza is a 44 year old female presents for follow-up of right knee and leg pain. Recall that on 03/13/14 she underwent a right knee corticosteroid injection and was found to have some hypoechoic change at the lateral meniscus. She says that the injection significantly helped her knee pain. She has been doing a home exercise program and modifying her exercise routine to avoid deep lunges or squats. She denies any mechanical locking, popping, or knee giving way. Occasionally, she will notice that the knee seems more swollen. She intermittently has some new posterior hamstring burning type pain just extending from the gluteal region to the posterior knee. She denies any further radiation, numbness, tingling, low back pain, or weakness. She does not recall an exact mechanism of injury. Symptoms are aggravated with pressing on the posterior hamstring region. Her primary activity is Zumba.  Past medical history, social history, medications, and allergies were reviewed and are up to date in the chart. Review of Systems 7 point review of systems was performed and was otherwise negative unless noted in the history of present illness.     Objective:   Physical Exam BP 143/64 mmHg  Ht 5\' 3"  (1.6 m)  Wt 165 lb (74.844 kg)  BMI 29.24 kg/m2  LMP 06/23/2012 GEN: The patient is well-developed well-nourished female and in no acute distress.  She is awake alert and oriented x3. SKIN: warm and well-perfused, no rash  EXTR: No lower extremity edema or calf tenderness Neuro: Strength 5/5 globally. Sensation intact throughout. No focal deficits. Vasc: +2 bilateral distal pulses. No edema.  MSK: Examination of the right knee reveals no overlying erythema, warmth, or induration. Range of motion is from 0-120 without pain. No valgus or varus instability. Negative Lachman's. Negative  McMurray's. Negative patellar grind test. No joint line tenderness. She does have some tenderness to palpation over the posterior lateral biceps femoris hamstring tendon at its insertion. Her hamstring strength is good. No atrophy noted.     Assessment & Plan:  Please see problem based assessment and plan in the problem list.

## 2014-04-17 NOTE — Assessment & Plan Note (Signed)
S/p injection 03/13/14, which helped. ?lateral meniscus hypoechoic change in [place] at that time. Improved significantly today.  -body helix compression sleeve fitted and applied to right knee, as this was evidently not available last visit -Wear when active -continue HEP, icing -Gently increase activity as tolerated -Discussed reasons to f/u including mechanical locking, swelling, or giving way -follow-up if needed

## 2014-04-17 NOTE — Assessment & Plan Note (Signed)
-  Ice, rehab eccentric exercises demonstrated -f/u prn

## 2014-06-22 ENCOUNTER — Encounter (HOSPITAL_COMMUNITY): Payer: Self-pay | Admitting: Emergency Medicine

## 2014-06-22 ENCOUNTER — Emergency Department (INDEPENDENT_AMBULATORY_CARE_PROVIDER_SITE_OTHER)
Admission: EM | Admit: 2014-06-22 | Discharge: 2014-06-22 | Disposition: A | Discharge status: Home / Self Care | Payer: Self-pay | Source: Home / Self Care | Attending: Family Medicine | Admitting: Family Medicine

## 2014-06-22 DIAGNOSIS — J302 Other seasonal allergic rhinitis: Secondary | ICD-10-CM

## 2014-06-22 MED ORDER — METHYLPREDNISOLONE ACETATE 80 MG/ML IJ SUSP
INTRAMUSCULAR | Status: AC
  Filled 2014-06-22: qty 1

## 2014-06-22 MED ORDER — IPRATROPIUM BROMIDE 0.06 % NA SOLN: 2.0000 | Freq: Four times a day (QID) | NASAL | Status: DC

## 2014-06-22 MED ORDER — METHYLPREDNISOLONE ACETATE 80 MG/ML IJ SUSP
80.0000 mg | Freq: Once | INTRAMUSCULAR | Status: AC
  Administered 2014-06-22: 80 mg via INTRAMUSCULAR

## 2014-06-22 MED ORDER — MECLIZINE HCL 50 MG PO TABS: 50.0000 mg | ORAL_TABLET | Freq: Three times a day (TID) | ORAL | Status: DC | PRN

## 2014-06-22 NOTE — ED Notes (Signed)
C/o dizziness associated w/HA onset 5/6 Denies fevers, cough, congestion Reports she was seen here a year ago w/similar sx and was given IM inj and nasal sprays w/relief Alert, no signs of acute distress.

## 2014-06-22 NOTE — ED Notes (Signed)
[facility] called and stated that meclizine does not come in 50 mg; they wanted to know if it's ok to Rx 25 mg 2 tabs TID PRN w/60 to be disp.; Per Dr. Juventino Slovak, ok to do so.

## 2014-06-22 NOTE — ED Provider Notes (Signed)
CSN: [id number]     Arrival date & time 06/22/14  7001 History   First MD Initiated Contact with Patient 06/22/14 0920     Chief Complaint  Patient presents with  . Dizziness   (Consider location/radiation/quality/duration/timing/severity/associated sxs/prior Treatment) Patient is a 44 y.o. female presenting with dizziness. The history is provided by the patient.  Dizziness Quality:  Lightheadedness Severity:  Mild Onset quality:  Gradual Duration:  1 week Progression:  Unchanged Chronicity:  Recurrent (similar to sx 1 yr ago , relieved with meds given.) Relieved by:  None tried Worsened by:  Nothing Ineffective treatments:  None tried Associated symptoms: no nausea, no tinnitus and no vomiting     Past Medical History  Diagnosis Date  . Anemia   . DUB (dysfunctional uterine bleeding)    Past Surgical History  Procedure Laterality Date  . No past surgeries    . Laparoscopic assisted vaginal hysterectomy N/A 07/14/2012    Procedure: LAPAROSCOPIC ASSISTED VAGINAL HYSTERECTOMY;  Surgeon: Lavonia Drafts, MD;  Location: [facility];  Service: Gynecology;  Laterality: N/A;  . Bilateral salpingectomy Bilateral 07/14/2012    Procedure: BILATERAL SALPINGECTOMY;  Surgeon: Lavonia Drafts, MD;  Location: [facility];  Service: Gynecology;  Laterality: Bilateral;  . Abdominal hysterectomy  07/14/2012   Family History  Problem Relation Age of Onset  . Hypertension Mother   . Heart attack Father    History  Substance Use Topics  . Smoking status: Never Smoker   . Smokeless tobacco: Never Used  . Alcohol Use: No   OB History    Gravida Para Term Preterm AB TAB SAB Ectopic Multiple Living   4 4 4       4      Review of Systems  Constitutional: Negative.  Negative for fever and chills.  HENT: Positive for congestion, postnasal drip and rhinorrhea. Negative for sore throat and tinnitus.   Respiratory: Negative.   Cardiovascular: Negative.   Gastrointestinal: Negative for nausea  and vomiting.  Neurological: Positive for dizziness.    Allergies  Review of patient's allergies indicates no known allergies.  Home Medications   Prior to Admission medications   Medication Sig Start Date End Date Taking? Authorizing Provider  Fe Fum-FePoly-FA-Vit C-Vit B3 (INTEGRA F PO) Take 1 capsule by mouth daily.    Historical Provider, MD  ferrous sulfate 325 (65 FE) MG tablet Take 325 mg by mouth 2 (two) times daily.    Historical Provider, MD  fluticasone (FLONASE) 50 MCG/ACT nasal spray Place 1 spray into both nostrils 2 (two) times daily. 05/12/13   Billy Fischer, MD  ipratropium (ATROVENT) 0.06 % nasal spray Place 2 sprays into both nostrils 4 (four) times daily. 06/22/14   Billy Fischer, MD  meclizine (ANTIVERT) 50 MG tablet Take 1 tablet (50 mg total) by mouth 3 (three) times daily as needed. Prn dizziness 06/22/14   Billy Fischer, MD  Multiple Vitamins-Minerals (MULTIVITAMIN WITH MINERALS) tablet Take 1 tablet by mouth daily.    Historical Provider, MD  naproxen (NAPROSYN) 375 MG tablet Take 1 tablet (375 mg total) by mouth 2 (two) times daily with a meal. As needed for knee pain and swelling 02/23/14   Lutricia Feil, PA  oxyCODONE-acetaminophen (PERCOCET/ROXICET) 5-325 MG per tablet Take 1-2 tablets by mouth every 6 (six) hours as needed for pain. 07/14/12   Lavonia Drafts, MD   BP 119/87 mmHg  Pulse 66  Temp(Src) 98.3 F (36.8 C) (Oral)  Resp 16  SpO2 99%  LMP 06/23/2012 Physical Exam  Constitutional: She is oriented to person, place, and time. She appears well-developed and well-nourished.  HENT:  Right Ear: External ear normal.  Left Ear: External ear normal.  Mouth/Throat: Oropharynx is clear and moist.  Eyes: EOM are normal. Pupils are equal, round, and reactive to light.  Neck: Normal range of motion. Neck supple.  Cardiovascular: Normal rate, regular rhythm, normal heart sounds and intact distal pulses.   Pulmonary/Chest: Effort normal and breath  sounds normal.  Lymphadenopathy:    She has no cervical adenopathy.  Neurological: She is alert and oriented to person, place, and time. No cranial nerve deficit. Coordination normal.  Skin: Skin is warm and dry.  Nursing note and vitals reviewed.   ED Course  Procedures (including critical care time) Labs Review Labs Reviewed - No data to display  Imaging Review No results found.   MDM   1. Seasonal allergic rhinitis        Billy Fischer, MD 06/22/14 [phone]

## 2014-07-26 ENCOUNTER — Other Ambulatory Visit: Payer: Self-pay | Admitting: Obstetrics and Gynecology

## 2014-07-26 DIAGNOSIS — Z1231 Encounter for screening mammogram for malignant neoplasm of breast: Secondary | ICD-10-CM

## 2014-08-02 ENCOUNTER — Ambulatory Visit (HOSPITAL_COMMUNITY): Payer: Self-pay

## 2014-08-03 ENCOUNTER — Encounter: Payer: Self-pay | Admitting: Sports Medicine

## 2014-08-03 ENCOUNTER — Ambulatory Visit (INDEPENDENT_AMBULATORY_CARE_PROVIDER_SITE_OTHER): Payer: Self-pay | Admitting: Sports Medicine

## 2014-08-03 VITALS — BP 118/62 | HR 70 | Ht 63.0 in | Wt 165.0 lb

## 2014-08-03 DIAGNOSIS — M25561 Pain in right knee: Secondary | ICD-10-CM

## 2014-08-03 MED ORDER — METHYLPREDNISOLONE ACETATE 40 MG/ML IJ SUSP
40.0000 mg | Freq: Once | INTRAMUSCULAR | Status: AC
  Administered 2014-08-03: 40 mg via INTRA_ARTICULAR

## 2014-08-03 NOTE — Progress Notes (Signed)
Patient ID: Jill Meza, female   DOB: May 06, 1970, 44 y.o.   MRN: [id number]   Interpreter for visit is Jacqulyn Bath  Subjective: Patient is a 44 year old female presents for follow-up of right knee pain. She has noticed some recurrence of her right knee pain without any recent injury. Location of pain is primarily diffuse anterior and medial joint line. She notes associated intermittent sharp catching and popping. Mild swelling. Denies any fevers or chills. Denies any knee giving way. Denies any locking. She is active with zumba, and tries not to do deep knee bends if possible. She says that the cortical started injection performed last March significantly helped her symptoms. She inquires regarding a repeat cortical started injection.  Past medical history, social history, medications, and allergies were reviewed and are up to date in the chart. 7 point review of systems was performed and was otherwise negative unless noted in the history of present illness.  Objective: BP 118/62 mmHg  Pulse 70  Ht 5\' 3"  (1.6 m)  Wt 165 lb (74.844 kg)  BMI 29.24 kg/m2  LMP 06/23/2012 GEN: The patient is well-developed well-nourished female and in no acute distress.  She is awake alert and oriented x3. SKIN: warm and well-perfused, no rash  EXTR: No lower extremity edema or calf tenderness Neuro: Strength 5/5 globally. Sensation intact throughout. DTRs 2/4 bilaterally. No focal deficits. Vasc: +2 bilateral distal pulses. No edema.  MSK: Examination of the right knee reveals range of motion from 0-120 with mild pain at the endpoint of flexion. Trace effusion. No overlying erythema or induration. Lateral joint line tenderness to palpation. No valgus or varus laxity. Negative Lachman's. The quadriceps and patellar tendons are grossly intact.  Procedure:  Risks, benefits and complications were reviewed with the patient. After obtaining informed verbal consent the right knee was sterilely  prepped with alcohol.  Ethyl chloride was used to anesthetize the skin and intra-articular injection using a 25-gauge by 1-1/2 inch needle was accomplished without difficulty.  We injected 40mg  Depo Medrol with 2 cc 1% lidocaine without epinephrine and 2 cc's 0.5% marcaine.  Patient tolerated the procedure well and was observed for 5-10 min. Post injection instructions, including signs and symptoms of complications were discussed.  Assessment: Please see problem based assessment and plan in the problem list.

## 2014-08-03 NOTE — Assessment & Plan Note (Signed)
Suspicious for symptomatic degenerative meniscal tear -CST injection #2 today. Tolerated well. -Rest, ice, elevation, compression sleeve. Avoid deep lunges or bends -Follow-up in 1 month if still symptomatic. Consider MRI right knee if not improving.

## 2014-10-10 ENCOUNTER — Other Ambulatory Visit: Payer: Self-pay | Admitting: Internal Medicine

## 2014-10-10 DIAGNOSIS — Z1231 Encounter for screening mammogram for malignant neoplasm of breast: Secondary | ICD-10-CM

## 2014-10-24 ENCOUNTER — Ambulatory Visit (HOSPITAL_COMMUNITY)
Admission: RE | Admit: 2014-10-24 | Discharge: 2014-10-24 | Disposition: A | Discharge status: Home / Self Care | Payer: Self-pay | Source: Ambulatory Visit | Attending: Internal Medicine | Admitting: Internal Medicine

## 2014-10-24 DIAGNOSIS — Z1231 Encounter for screening mammogram for malignant neoplasm of breast: Secondary | ICD-10-CM

## 2014-10-26 ENCOUNTER — Telehealth: Payer: Self-pay | Admitting: *Deleted

## 2014-10-26 NOTE — Telephone Encounter (Signed)
-----   Message from Tresa Garter, MD sent at 10/26/2014  9:38 AM EDT ----- Please inform patient that her screening mammogram shows no evidence of malignancy.

## 2016-05-29 ENCOUNTER — Other Ambulatory Visit: Payer: Self-pay | Admitting: Obstetrics and Gynecology

## 2016-05-29 DIAGNOSIS — Z1231 Encounter for screening mammogram for malignant neoplasm of breast: Secondary | ICD-10-CM

## 2016-06-29 ENCOUNTER — Ambulatory Visit
Admission: RE | Admit: 2016-06-29 | Discharge: 2016-06-29 | Disposition: A | Discharge status: Home / Self Care | Payer: No Typology Code available for payment source | Source: Ambulatory Visit | Attending: Obstetrics and Gynecology | Admitting: Obstetrics and Gynecology

## 2016-06-29 DIAGNOSIS — Z1231 Encounter for screening mammogram for malignant neoplasm of breast: Secondary | ICD-10-CM

## 2016-10-29 ENCOUNTER — Emergency Department (HOSPITAL_COMMUNITY)
Admission: EM | Admit: 2016-10-29 | Discharge: 2016-10-29 | Disposition: A | Discharge status: Home / Self Care | Payer: No Typology Code available for payment source | Attending: Physician Assistant | Admitting: Physician Assistant

## 2016-10-29 ENCOUNTER — Encounter (HOSPITAL_COMMUNITY): Payer: Self-pay | Admitting: Emergency Medicine

## 2016-10-29 DIAGNOSIS — Y9389 Activity, other specified: Secondary | ICD-10-CM | POA: Insufficient documentation

## 2016-10-29 DIAGNOSIS — Y92410 Unspecified street and highway as the place of occurrence of the external cause: Secondary | ICD-10-CM | POA: Diagnosis not present

## 2016-10-29 DIAGNOSIS — Y999 Unspecified external cause status: Secondary | ICD-10-CM | POA: Diagnosis not present

## 2016-10-29 DIAGNOSIS — M549 Dorsalgia, unspecified: Secondary | ICD-10-CM | POA: Insufficient documentation

## 2016-10-29 MED ORDER — IBUPROFEN 400 MG PO TABS
800.0000 mg | ORAL_TABLET | Freq: Once | ORAL | Status: AC
  Administered 2016-10-29: 800 mg via ORAL
  Filled 2016-10-29: qty 2

## 2016-10-29 MED ORDER — IBUPROFEN 800 MG PO TABS: 800.0000 mg | ORAL_TABLET | Freq: Three times a day (TID) | ORAL | 0 refills | Status: AC

## 2016-10-29 MED ORDER — CYCLOBENZAPRINE HCL 10 MG PO TABS: 10.0000 mg | ORAL_TABLET | Freq: Two times a day (BID) | ORAL | 0 refills | Status: DC | PRN
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
## 2017-01-29 ENCOUNTER — Encounter (HOSPITAL_COMMUNITY): Payer: Self-pay

## 2017-09-16 ENCOUNTER — Emergency Department (HOSPITAL_COMMUNITY): Payer: Self-pay

## 2017-09-16 ENCOUNTER — Other Ambulatory Visit: Payer: Self-pay

## 2017-09-16 ENCOUNTER — Emergency Department (HOSPITAL_COMMUNITY)
Admission: EM | Admit: 2017-09-16 | Discharge: 2017-09-16 | Disposition: A | Discharge status: Home / Self Care | Payer: Self-pay | Attending: Emergency Medicine | Admitting: Emergency Medicine

## 2017-09-16 ENCOUNTER — Encounter (HOSPITAL_COMMUNITY): Payer: Self-pay

## 2017-09-16 DIAGNOSIS — Y9389 Activity, other specified: Secondary | ICD-10-CM | POA: Insufficient documentation

## 2017-09-16 DIAGNOSIS — W01198A Fall on same level from slipping, tripping and stumbling with subsequent striking against other object, initial encounter: Secondary | ICD-10-CM | POA: Insufficient documentation

## 2017-09-16 DIAGNOSIS — S8002XA Contusion of left knee, initial encounter: Secondary | ICD-10-CM | POA: Insufficient documentation

## 2017-09-16 DIAGNOSIS — W19XXXA Unspecified fall, initial encounter: Secondary | ICD-10-CM

## 2017-09-16 DIAGNOSIS — Z79899 Other long term (current) drug therapy: Secondary | ICD-10-CM | POA: Insufficient documentation

## 2017-09-16 DIAGNOSIS — Y999 Unspecified external cause status: Secondary | ICD-10-CM | POA: Insufficient documentation

## 2017-09-16 DIAGNOSIS — Y92513 Shop (commercial) as the place of occurrence of the external cause: Secondary | ICD-10-CM | POA: Insufficient documentation

## 2017-09-16 MED ORDER — NAPROXEN 500 MG PO TABS: 500.0000 mg | ORAL_TABLET | Freq: Two times a day (BID) | ORAL | 0 refills | Status: DC

## 2017-09-16 MED ORDER — IBUPROFEN 200 MG PO TABS
600.0000 mg | ORAL_TABLET | Freq: Once | ORAL | Status: AC
  Administered 2017-09-16: 600 mg via ORAL
  Filled 2017-09-16: qty 3

## 2017-09-16 NOTE — ED Provider Notes (Signed)
[facility] DEPT Provider Note   CSN: [id number] Arrival date & time: 09/16/17  1637     History   Chief Complaint Chief Complaint  Patient presents with  . Fall  . Knee Pain    HPI Jill Meza is a 47 y.o. female who presents to the ED s/p fall with knee pain. Patient reports she [place] at the mall and walked up to get her food and slipped in a puddle of water. When she fell she hit her left knee. Patient c/o pain and swelling. Patient denies head or neck injury or LOC.   HPI  History reviewed. No pertinent past medical history.  There are no active problems to display for this patient.   Past Surgical History:  Procedure Laterality Date  . ABDOMINAL HYSTERECTOMY       OB History   None      Home Medications    Prior to Admission medications   Medication Sig Start Date End Date Taking? Authorizing Provider  cyclobenzaprine (FLEXERIL) 10 MG tablet Take 1 tablet (10 mg total) by mouth 2 (two) times daily as needed for muscle spasms. 10/29/16   Mackuen, Courteney Lyn, MD  ibuprofen (ADVIL,MOTRIN) 800 MG tablet Take 1 tablet (800 mg total) by mouth 3 (three) times daily. 10/29/16   Mackuen, Courteney Lyn, MD  naproxen (NAPROSYN) 500 MG tablet Take 1 tablet (500 mg total) by mouth 2 (two) times daily. 09/16/17   Ashley Murrain, NP    Family History Family History  Problem Relation Age of Onset  . Hypertension Mother   . Heart attack Father     Social History Social History   Tobacco Use  . Smoking status: Never Smoker  . Smokeless tobacco: Never Used  Substance Use Topics  . Alcohol use: Never    Frequency: Never  . Drug use: Never     Allergies   Patient has no known allergies.   Review of Systems Review of Systems  Musculoskeletal: Positive for arthralgias.  All other systems reviewed and are negative.    Physical Exam Updated Vital Signs BP 128/83 (BP Location: Right Arm)   Pulse 67   Temp 99.4 F (37.4 C) (Oral)    Resp 18   Wt 71.2 kg   SpO2 96%   Physical Exam  Constitutional: She appears well-developed and well-nourished. No distress.  HENT:  Head: Normocephalic and atraumatic.  Eyes: EOM are normal.  Neck: Neck supple.  Cardiovascular: Normal rate.  Pulmonary/Chest: Effort normal.  Abdominal: Soft. There is no tenderness.  Musculoskeletal:       Left knee: She exhibits decreased range of motion. She exhibits no swelling, no ecchymosis, no deformity and normal alignment. Tenderness found.       Legs: Neurological: She is alert.  Skin: Skin is warm and dry.  Psychiatric: She has a normal mood and affect.  Nursing note and vitals reviewed.    ED Treatments / Results  Labs (all labs ordered are listed, but only abnormal results are displayed) Labs Reviewed - No data to display  Radiology Dg Knee Complete 4 Views Left  Result Date: 09/16/2017 CLINICAL DATA:  Fall, pain. EXAM: LEFT KNEE - COMPLETE 4+ VIEW COMPARISON:  None. FINDINGS: No evidence of fracture, dislocation, or joint effusion. No evidence of arthropathy or other focal bone abnormality. Soft tissues are unremarkable. IMPRESSION: Negative. Electronically Signed   By: Franki Cabot M.D.   On: 09/16/2017 17:25    Procedures Procedures (including critical care time)  Medications Ordered in ED Medications  ibuprofen (ADVIL,MOTRIN) tablet 600 mg (has no administration in time range)     Initial Impression / Assessment and Plan / ED Course  I have reviewed the triage vital signs and the nursing notes. 47 y.o. female here with left knee pain s/p fall stable for d/c without fracture or dislocation noted on x-ray. Full passive range of motion without pain. Patient has pain with palpation of the patella. Will treat for knee contusion with knee sleeve and NSAIDS and ice. Patient to f/u with ortho if symptoms persist.   Final Clinical Impressions(s) / ED Diagnoses   Final diagnoses:  Contusion of left knee, initial encounter    Fall, initial encounter    ED Discharge Orders         Ordered    naproxen (NAPROSYN) 500 MG tablet  2 times daily     09/16/17 1747           Debroah Baller Cashiers, NP 09/16/17 1750    Sherwood Gambler, MD 09/17/17 0001

## 2017-09-16 NOTE — Discharge Instructions (Signed)
Take the medication as directed, follow up with your doctor or the orthopedic doctor if symptoms worsen. Return here as needed.

## 2017-09-16 NOTE — ED Notes (Signed)
Bed: WTR9 Expected date: 09/16/17 Expected time: 4:40 PM Means of arrival: Ambulance Comments: Fall, leg pain

## 2017-09-16 NOTE — ED Triage Notes (Signed)
Pt fell at the [employer]. Pt went up to get her food and slipped in a puddle of water. Left knee pain. No deformities. Did not hit head, no complaints of neck or back pain.

## 2017-09-16 NOTE — ED Triage Notes (Signed)
Pt reports that she slipped in ice at the mall and stuck l/knee on the floor. Denies any other trauma

## 2019-01-09 ENCOUNTER — Other Ambulatory Visit (HOSPITAL_COMMUNITY): Payer: Self-pay | Admitting: *Deleted

## 2019-01-09 DIAGNOSIS — Z1231 Encounter for screening mammogram for malignant neoplasm of breast: Secondary | ICD-10-CM

## 2019-03-09 ENCOUNTER — Other Ambulatory Visit: Payer: Self-pay

## 2019-03-09 ENCOUNTER — Ambulatory Visit (HOSPITAL_COMMUNITY)
Admission: RE | Admit: 2019-03-09 | Discharge: 2019-03-09 | Disposition: A | Discharge status: Home / Self Care | Payer: Self-pay | Source: Ambulatory Visit | Attending: Obstetrics and Gynecology | Admitting: Obstetrics and Gynecology

## 2019-03-09 ENCOUNTER — Encounter (HOSPITAL_COMMUNITY): Payer: Self-pay

## 2019-03-09 ENCOUNTER — Ambulatory Visit
Admission: RE | Admit: 2019-03-09 | Discharge: 2019-03-09 | Disposition: A | Discharge status: Home / Self Care | Payer: No Typology Code available for payment source | Source: Ambulatory Visit | Attending: Obstetrics and Gynecology | Admitting: Obstetrics and Gynecology

## 2019-03-09 DIAGNOSIS — Z1239 Encounter for other screening for malignant neoplasm of breast: Secondary | ICD-10-CM | POA: Insufficient documentation

## 2019-03-09 DIAGNOSIS — Z1231 Encounter for screening mammogram for malignant neoplasm of breast: Secondary | ICD-10-CM

## 2019-03-09 NOTE — Patient Instructions (Signed)
Explained breast self awareness with Dorise Hiss Escamilla-Loera. Patient did not need a Pap smear today due to patient has a history of a hysterectomy for benign reasons. Let patient know that she doesn't need any further Pap smears due to her history of a hysterectomy for benign reasons. Referred patient to the [facility] for a screening mammogram. Appointment scheduled for Thursday, March 09, 2019 at 1510. Patient aware of appointment and will be there. Let patient know the [facility] will follow up with her within the next couple weeks with results of her mammogram by letter or phone. Dorise Hiss Escamilla-Loera verbalized understanding.  Haim Hansson, Arvil Chaco, RN 1:30 PM

## 2019-03-09 NOTE — Progress Notes (Signed)
No complaints today.   Pap Smear: Pap smear not completed today. Last Pap smear was 03/14/2012 at the free cervical cancer screening at the [facility] and normal. Per patient no history of an abnormal Pap smear. Patient has a history of a hysterectomy 07/14/2012 due to fibroids and AUB. Patient doesn't need any further Pap smears due to her history of a hysterectomy for benign reasons per BCCCP and ASCCP guidelines. Last pap smear result is scanned in EPIC under media.   Physical exam: Breasts Breasts symmetrical. No skin abnormalities bilateral breasts. No nipple retraction bilateral breasts. No nipple discharge bilateral breasts. No lymphadenopathy. No lumps palpated bilateral breasts. No complaints of pain or tenderness on exam. Referred patient to the [facility] for a screening mammogram. Appointment scheduled for Thursday, March 09, 2019 at 1510.        Pelvic/Bimanual No Pap smear completed today since patient has a history of a hysterectomy for benign reasons. Pap smear not indicated per BCCCP guidelines.   Smoking History: Patient has never smoked.  Patient Navigation: Patient education provided. Access to services provided for patient through [facility] program. Spanish interpreter provided.   Colorectal Cancer Screening: Per patient has never had a colonoscopy completed. No complaints today.   Breast and Cervical Cancer Risk Assessment: Patient has no family history of breast cancer, known genetic mutations, or radiation treatment to the chest before age 82. Patient has no history of cervical dysplasia, immunocompromised, or DES exposure in-utero.  Risk Assessment    Risk Scores      03/09/2019   Last edited by: Loletta Parish, RN   5-year risk: 0.6 %   Lifetime risk: 5.8 %         Used Spanish interpreter Rudene Anda from [place].

## 2019-03-22 ENCOUNTER — Telehealth: Payer: Self-pay

## 2019-03-22 ENCOUNTER — Inpatient Hospital Stay: Payer: Self-pay | Attending: Obstetrics and Gynecology | Admitting: *Deleted

## 2019-03-22 ENCOUNTER — Other Ambulatory Visit: Payer: Self-pay

## 2019-03-22 ENCOUNTER — Other Ambulatory Visit: Payer: Self-pay | Admitting: Obstetrics and Gynecology

## 2019-03-22 VITALS — BP 116/74 | Temp 98.4°F | Ht 63.75 in | Wt 153.0 lb

## 2019-03-22 DIAGNOSIS — Z0000 Encounter for general adult medical examination without abnormal findings: Secondary | ICD-10-CM

## 2019-03-22 NOTE — Progress Notes (Signed)
Wisewoman initial screening   interpreter- Rudene Anda, [employer]   Clinical Measurement:  Height: 63.75 in Weight: 153 lb   Blood Pressure: 115/78  Blood Pressure #2: 116/74 Fasting Labs Drawn Today, will review with patient when they result.   Medical History:  Patient states that she does not have a history of high cholesterol, high blood pressure or diabetes.  Medications:  Patient states that she does not have a history of medication to lower cholesterol, blood pressure or blood sugar.  Patient does not take an aspirin a day to help prevent a heart attack or stroke.    Blood pressure, self measurement: Patient states that she does measure blood pressure from home and has not been told to do so by a healthcare provider.    Nutrition: Patient states that on average she eats 0 cups of fruit and 0 cups of vegetables per day. Patient states that she does eat fish at least 2 times per week. Patient eats about half servings of whole grains. Patient does not drink less than 36 ounces of beverages with added sugar weekly. Patient is currently watching sodium or salt intake. In the past 7 days patient has not had any drinks containing alcohol. On average patient does not drink any drinks containing alcohol.      Physical activity:  Patient states that she gets 120 minutes of moderate and 120 minutes of vigorous physical activity each week.  Smoking status:  Patient states that she has never smoked tobacco.   Quality of life:  Over the past 2 weeks patient states that she has not had any days where she has little interest or pleasure in doing things and 0 days where she has felt down, depressed or hopeless.    Risk reduction and counseling:    Health Coaching: Explained that the recommendation is for 2 cups of fruit and 3 cups of vegetables a day. Showed patient what one serving would look like. Encouraged patient to add more fruits and vegetables into diet. Also encouraged patient to add in more  heart healthy fish (salmon, tuna, mackerel or sardines) and whole grains (brown rice, whole grain cereals, whole wheat bread or oatmeal) into diet. Also spoke with patient about reducing the amount of beverages with added sugars consumed. Patient drinks about 2 cups of juice per day. Encouraged patient to be mindful of the amount of sugars in the juice that she consumes. Encouraged patient to try and keep it to 36 oz or less per week.   Navigation:  I will notify patient of lab results.  Patient is aware of 2 more health coaching sessions and a follow up.  Time: 25 minutes

## 2019-03-23 LAB — LIPID PANEL W/O CHOL/HDL RATIO
Cholesterol, Total: 157 mg/dL (ref 100–199)
HDL: 60 mg/dL (ref >39)
LDL Chol Calc (NIH): 85 mg/dL (ref 0–99)
Triglycerides: 57 mg/dL (ref 0–149)
VLDL Cholesterol Cal: 12 mg/dL (ref 5–40)

## 2019-03-23 LAB — GLUCOSE, RANDOM: Glucose: 91 mg/dL (ref 65–99)

## 2019-03-23 LAB — HGB A1C W/O EAG: Hgb A1c MFr Bld: 5.4 % (ref 4.8–5.6)

## 2019-03-30 NOTE — Telephone Encounter (Signed)
Health coaching 2   interpreter- Rudene Anda, [employer]   Labs-157 cholesterol , 85 LDL cholesterol , 57 triglycerides , 60 HDL cholesterol , 5.4 hemoglobin A1C , 91 mean plasma glucose  Patient understands and is aware of her lab results.   Goals- Discussed lab results with patient and answered any questions that patient had regarding results.   Goals- Increase the amount of fruits and vegetables consumed. Patient is not currently eating fruits and vegetables every day. Ultimate goal should be for 2 cups of fruit and 3 cups of vegetables per day. Increase the amount of heart healthy fish consumed to at least 2 servings per week. Choices can include salmon, tuna, mackerel or sardines. Decrease the amount of beverages with added sugars consumed. Patient currently consumes 2 cups of juice a day. Goal should be for less than 36 ounces a week. Continue current exercise regimen.   Navigation:  Patient is aware of 1 more health coaching sessions and a follow up.   Time- 10 minutes

## 2020-01-17 ENCOUNTER — Telehealth: Payer: Self-pay

## 2020-01-17 ENCOUNTER — Other Ambulatory Visit: Payer: Self-pay

## 2020-01-17 DIAGNOSIS — Z1231 Encounter for screening mammogram for malignant neoplasm of breast: Secondary | ICD-10-CM

## 2020-01-17 NOTE — Telephone Encounter (Signed)
Health Coaching 3  interpreter- Rudene Anda, [facility]   Goals- Patient states that she has been walking and running at least 4 times per week for at least 30 minutes. Patient states that she has also been taking "step" classes and "intense zumba" from home.    New goal- Patient states that she has been struggling with her water, fruits and vegetables intake. Increase water and fruits and vegetable intake.  Barrier to reaching goal- Patient doesn't care for drinking plain water.   Strategies to overcome- Encouraged patient to try adding fruit to water to give more flavor.    Navigation:  Patient is aware of  a follow up session. Patient is scheduled for follow-up visit on February 19, 2020 @ 10:45 am   Time- 10 minutes

## 2020-02-19 ENCOUNTER — Ambulatory Visit: Payer: No Typology Code available for payment source

## 2020-02-26 ENCOUNTER — Ambulatory Visit: Payer: No Typology Code available for payment source

## 2020-02-28 ENCOUNTER — Ambulatory Visit: Payer: No Typology Code available for payment source

## 2020-03-04 NOTE — Progress Notes (Unsigned)
error 

## 2020-03-13 ENCOUNTER — Inpatient Hospital Stay: Payer: No Typology Code available for payment source

## 2020-03-13 ENCOUNTER — Other Ambulatory Visit: Payer: Self-pay

## 2020-03-14 ENCOUNTER — Ambulatory Visit: Payer: No Typology Code available for payment source

## 2020-05-08 ENCOUNTER — Encounter (INDEPENDENT_AMBULATORY_CARE_PROVIDER_SITE_OTHER): Payer: Self-pay

## 2020-05-08 ENCOUNTER — Other Ambulatory Visit: Payer: Self-pay

## 2020-05-08 ENCOUNTER — Inpatient Hospital Stay: Payer: Self-pay | Attending: Obstetrics and Gynecology | Admitting: *Deleted

## 2020-05-08 VITALS — BP 122/80 | Ht 63.0 in | Wt 155.2 lb

## 2020-05-08 DIAGNOSIS — Z0000 Encounter for general adult medical examination without abnormal findings: Secondary | ICD-10-CM

## 2020-05-08 NOTE — Progress Notes (Signed)
Wisewoman initial [place], Erling Cruz   Clinical Measurement:  Vitals:   05/08/20 0847  BP: 124/86   Fasting Labs Drawn Today, will review with patient when they result.   Medical History:  Patient states that she does not have high cholesterol, does not have high blood pressure and she does not have diabetes.  Medications:  Patient states that she does not take medication to lower cholesterol, blood pressure or blood sugar.  Patient does not take an aspirin a day to help prevent a heart attack or stroke.    Blood pressure, self measurement: Patient states that she does not measure blood pressure from home. She checks her blood pressure N/A. She shares her readings with a health care provider: N/A.   Nutrition: Patient states that on average she eats 1 cups of fruit and 0 cups of vegetables per day. Patient states that she does not eat fish at least 2 times per week. Patient eats less than half servings of whole grains. Patient drinks less than 36 ounces of beverages with added sugar weekly: yes. Patient is currently watching sodium or salt intake: no. In the past 7 days patient has consumed drinks containing alcohol on 0 days. On a day that patient consumes drinks containing alcohol on average 0 drinks are consumed.      Physical activity:  Patient states that she gets 60 minutes of moderate and 60 minutes of vigorous physical activity each week.  Smoking status:  Patient states that she has has never smoked .   Quality of life:  Over the past 2 weeks patient states that she had little interest or pleasure in doing things: not at all. She has been feeling down, depressed or hopeless:not at all.    Risk reduction and counseling:  Health Coaching: Spoke with patient about the daily recommendation for fruits and vegetables (2 cups fruit, 3 cups vegetables). Patient currently does not consume fish twice a week. She does like tilapia. Gave suggestions for heart  healthy fish she can try adding to diet (salmon, tuna, mackerel, sardines or seabass). Patient consumes whole wheat bread. Gave suggestions for other whole grains (whole wheat pasta, whole grain cereals, oatmeal or brown rice). Encouraged patient to continue with exercise routine (walking and running, x4-5 weekly, 30-40 minutes).    Navigation:  I will notify patient of lab results.  Patient is aware of 2 more health coaching sessions and a follow up.  Time: 25  minutes

## 2020-05-09 LAB — LIPID PANEL
Chol/HDL Ratio: 2.6 ratio (ref 0.0–4.4)
Cholesterol, Total: 156 mg/dL (ref 100–199)
HDL: 59 mg/dL (ref >39)
LDL Chol Calc (NIH): 86 mg/dL (ref 0–99)
Triglycerides: 52 mg/dL (ref 0–149)
VLDL Cholesterol Cal: 11 mg/dL (ref 5–40)

## 2020-05-09 LAB — GLUCOSE, RANDOM: Glucose: 95 mg/dL (ref 65–99)

## 2020-05-09 LAB — HEMOGLOBIN A1C
Est. average glucose Bld gHb Est-mCnc: 108 mg/dL
Hgb A1c MFr Bld: 5.4 % (ref 4.8–5.6)

## 2020-05-13 ENCOUNTER — Telehealth: Payer: Self-pay

## 2020-05-13 NOTE — Telephone Encounter (Signed)
Health coaching 2   interpreter- [place] [phone]   Labs- 156 cholesterol, 86 LDL cholesterol, 52 triglycerides, 59 HDL cholesterol, 5.4 hemoglobin A1C, 95 mean plasma glucose. Patient understands and is aware of her lab results.   Goals-  1. Increase the amount of fruits and vegetables in daily diet.  2. Increase the amount of whole grains consumed. (brown rice, whole wheat bread, oatmeal, whole grain cereals and whole wheat pasta. 3. Increase the amount of heart healthy fish consumed. (salmon, tuna, mackerel, sardines or sea bass) 4. 20-30 minutes a day of exercise.   Navigation:  Patient is aware of 1 more health coaching sessions and a follow up.   Time- 10 minutes

## 2020-06-11 ENCOUNTER — Ambulatory Visit: Payer: No Typology Code available for payment source

## 2020-06-13 ENCOUNTER — Encounter (INDEPENDENT_AMBULATORY_CARE_PROVIDER_SITE_OTHER): Payer: Self-pay

## 2020-06-13 ENCOUNTER — Other Ambulatory Visit: Payer: Self-pay

## 2020-06-13 ENCOUNTER — Ambulatory Visit
Admission: RE | Admit: 2020-06-13 | Discharge: 2020-06-13 | Disposition: A | Discharge status: Home / Self Care | Payer: No Typology Code available for payment source | Source: Ambulatory Visit | Attending: Obstetrics and Gynecology | Admitting: Obstetrics and Gynecology

## 2020-06-13 ENCOUNTER — Ambulatory Visit: Payer: Self-pay | Admitting: *Deleted

## 2020-06-13 VITALS — BP 136/80 | Wt 153.4 lb

## 2020-06-13 DIAGNOSIS — Z1239 Encounter for other screening for malignant neoplasm of breast: Secondary | ICD-10-CM

## 2020-06-13 NOTE — Patient Instructions (Signed)
Explained breast self awareness with Ivory Escamilla-Torres. Patient did not need a Pap smear today due to patient has a history of a hysterectomy for benign reasons. Let patient know that she doesn't need any further Pap smears due to her history of a hysterectomy for benign reasons. Referred patient to the [facility] for a screening mammogram on the mobile unit. Appointment scheduled Thursday, Jun 13, 2020 at 1030. Patient escorted to the mobile unit following BCCCP appointment for her screening mammogram. Let patient know the [facility] Center will follow up with her within the next couple weeks with results of her mammogram by letter or phone. Deshawna Escamilla-Torres verbalized understanding.  Itzelle Gains, Arvil Chaco, RN 9:08 AM

## 2020-06-13 NOTE — Progress Notes (Signed)
[name] is a 50 y.o. female who presents to [facility] clinic today with no complaints.    Pap Smear: Pap smear not completed today. Last Pap smear was 03/14/2012 at the free cervical cancer screening at the [facility] and normal. Per patient no history of an abnormal Pap smear. Patient has a history of a hysterectomy 07/14/2012 due to fibroids and AUB. Patient doesn't need any further Pap smears due to her history of a hysterectomy for benign reasons per BCCCP and ASCCP guidelines. Last Pap smear result is available in Epic.   Physical exam: Breasts Left breast slightly larger than right breast. No skin abnormalities bilateral breasts. No nipple retraction bilateral breasts. No nipple discharge bilateral breasts. No lymphadenopathy. No lumps palpated bilateral breasts. No complaints of pain or tenderness on exam.      MS DIGITAL SCREENING BILATERAL  Result Date: 07/01/2016 CLINICAL DATA:  Screening. EXAM: DIGITAL SCREENING BILATERAL MAMMOGRAM WITH CAD COMPARISON:  Previous exam(s). ACR Breast Density Category c: The breast tissue is heterogeneously dense, which may obscure small masses. FINDINGS: There are no findings suspicious for malignancy. Images were processed with CAD. IMPRESSION: No mammographic evidence of malignancy. A result letter of this screening mammogram will be mailed directly to the patient. RECOMMENDATION: Screening mammogram in one year. (Code:SM-B-01Y) BI-RADS CATEGORY  1: Negative. Electronically Signed   By: Nolon Nations M.D.   On: 07/01/2016 07:38   MS DIGITAL SCREENING TOMO BILATERAL  Result Date: 03/13/2019 CLINICAL DATA:  Screening. EXAM: DIGITAL SCREENING BILATERAL MAMMOGRAM WITH TOMO AND CAD COMPARISON:  Previous exam(s). ACR Breast Density Category c: The breast tissue is heterogeneously dense, which may obscure small masses. FINDINGS: There are no findings suspicious for malignancy. Images were processed with CAD. IMPRESSION: No mammographic  evidence of malignancy. A result letter of this screening mammogram will be mailed directly to the patient. RECOMMENDATION: Screening mammogram in one year. (Code:SM-B-01Y) BI-RADS CATEGORY  1: Negative. Electronically Signed   By: Abelardo Diesel M.D.   On: 03/13/2019 13:15    Pelvic/Bimanual Pap is not indicated today per BCCCP guidelines.    Smoking History: Patient has never smoked.   Patient Navigation: Patient education provided. Access to services provided for patient through [facility] program. Spanish interpreter Jill Meza from [facility] provided.    Colorectal Cancer Screening: Per patient has never had colonoscopy completed. No complaints today.    Breast and Cervical Cancer Risk Assessment: Patient has family history of a maternal cousin having breast cancer. Patient has no known genetic mutations or history of radiation treatment to the chest before age 82. Patient does not have history of cervical dysplasia, immunocompromised, or DES exposure in-utero.  Risk Assessment    Risk Scores      06/13/2020 03/09/2019   Last edited by: Demetrius Revel, LPN Jill Meza, Jill Gold, RN   5-year risk: 0.6 % 0.6 %   Lifetime risk: 5.7 % 5.8 %          A: BCCCP exam without pap smear No complaints.  P: Referred patient to the [facility] for a screening mammogram on the mobile unit. Appointment scheduled Thursday, Jun 13, 2020 at 1030.  Loletta Parish, RN 06/13/2020 9:08 AM

## 2021-03-12 ENCOUNTER — Telehealth: Payer: Self-pay

## 2021-03-12 NOTE — Telephone Encounter (Signed)
Left message for patient via [employer] [employer] about completing [facility] 3 for the [facility] program. Left name and number for patient to call back.

## 2021-03-12 NOTE — Telephone Encounter (Signed)
Health Coaching 3  interpreter- Jill Meza, [facility]   Goals- Patient has still been exercising recently. Patient has been trying to maintain a healthy diet.    New goal- Patient states that she has not been eating fruits and vegetables daily. Increase fruits and vegetable intake. Goal is to add in at least 1 cup of fruit and 1 cup of vegetables daily.  Barrier to reaching goal-    Strategies to overcome- Start with 1 serving a day and increase from there.   Navigation:  Patient is aware of  a follow up session.Patient is scheduled for follow-up on Monday, Feb. 20 @ 10:15 am   Time-  10 minutes

## 2021-03-31 ENCOUNTER — Ambulatory Visit: Payer: No Typology Code available for payment source

## 2021-04-07 ENCOUNTER — Inpatient Hospital Stay: Payer: Self-pay | Attending: Obstetrics and Gynecology | Admitting: *Deleted

## 2021-04-07 ENCOUNTER — Other Ambulatory Visit: Payer: Self-pay

## 2021-04-07 VITALS — BP 128/76 | Ht 63.0 in | Wt 159.1 lb

## 2021-04-07 DIAGNOSIS — Z1231 Encounter for screening mammogram for malignant neoplasm of breast: Secondary | ICD-10-CM

## 2021-04-07 DIAGNOSIS — Z0000 Encounter for general adult medical examination without abnormal findings: Secondary | ICD-10-CM

## 2021-04-07 NOTE — Progress Notes (Signed)
Wisewoman follow up   Interpreter: Rudene Anda, [employer]  Clinical Measurement:   Vitals:   04/07/21 1108  BP: 130/76      Medical History:  Patient states that she does not have high cholesterol, does not have high blood pressure and she does not have diabetes.  Medications:  Patient states that she does not take medication to lower cholesterol, blood pressure and blood sugar.  Patient does not take an aspirin a day to help prevent a heart attack or stroke.    Blood pressure, self measurement: Patient states that she does not measure blood pressure from home. She checks her blood pressure N/A. She shares her readings with a health care provider: N/A.   Nutrition: Patient states that on average she eats 1 cups of fruit and 0 cups of vegetables per day. Patient states that she does not eat fish at least 2 times per week. Patient eats about half servings of whole grains. Patient drinks less than 36 ounces of beverages with added sugar weekly: yes. Patient is currently watching sodium or salt intake: yes. In the past 7 days patient has had 0 drinks containing alcohol. On average patient drinks 0 drinks containing alcohol per day.      Physical activity:  Patient states that she gets 060 minutes of moderate and 060 minutes of vigorous physical activity each week.  Smoking status:  Patient states that she has has never smoked .   Quality of life:  Over the past 2 weeks patient states that she had little interest or pleasure in doing things: not at all. She has been feeling down, depressed or hopeless:not at all.    Risk reduction and counseling:   Health Coaching: Spoke with patient about adding more fruits and vegetables into daily diet. Patient stated that she would like to try and start making smoothies. Patient has been eating fish about once a month. Encouraged patient if she can to try and add heart healthy fish into her weekly diet. Patient consumes whole wheat bread and whole grain  cereals. Gave suggestions for other whole grains to try adding into diet. Patient does a wide variety of low-intensity and high-intensity exercising (cycling, walking, running, cardio and dancing). Encouraged patient to continue with exercise routine.   Navigation: This was the  follow up session for this patient, I will check up on her progress in the coming months. Patient is scheduled for re-screening on May 19, 2021 @ 8:30 am.  Time: 20 minutes

## 2021-05-19 ENCOUNTER — Inpatient Hospital Stay: Payer: No Typology Code available for payment source | Attending: Obstetrics and Gynecology | Admitting: *Deleted

## 2021-05-19 ENCOUNTER — Other Ambulatory Visit: Payer: Self-pay

## 2021-05-19 VITALS — BP 122/82 | Ht 63.0 in | Wt 153.4 lb

## 2021-05-19 DIAGNOSIS — Z0000 Encounter for general adult medical examination without abnormal findings: Secondary | ICD-10-CM

## 2021-05-19 NOTE — Progress Notes (Signed)
Wisewoman initial screening ?  ?Interpreter- Rudene Anda, [place] ?  ?Clinical Measurement: There were no vitals filed for this visit. Fasting Labs Drawn Today, will review with patient when they result. ?  ?Medical History:  Patient states that she does not have high cholesterol, does not have high blood pressure and she does not have diabetes. ? ?Medications:  Patient states that she does not take medication to lower cholesterol, blood pressure or blood sugar.  Patient does not take an aspirin a day to help prevent a heart attack or stroke.  ?  ?Blood pressure, self measurement: Patient states that she does not measure blood pressure from home. She checks her blood pressure N/A. She shares her readings with a health care provider: N/A. ?  ?Nutrition: Patient states that on average she eats 1 cups of fruit and 1 cups of vegetables per day. Patient states that she does not eat fish at least 2 times per week. Patient eats less than half servings of whole grains. Patient drinks less than 36 ounces of beverages with added sugar weekly: yes. Patient is currently watching sodium or salt intake: yes. In the past 7 days patient has consumed drinks containing alcohol on 0 days. On a day that patient consumes drinks containing alcohol on average 0 drinks are consumed.     ? ?Physical activity:  Patient states that she gets 105 minutes of moderate and 105 minutes of vigorous physical activity each week. ? ?Smoking status:  Patient states that she has has never smoked . ?  ?Quality of life:  Over the past 2 weeks patient states that she had little interest or pleasure in doing things: not at all. She has been feeling down, depressed or hopeless:not at all.  ?  ?Risk reduction and counseling:  ? ?Goal: Patient has recently been exercising daily for at least 30 minutes. Patient has lost 6 pounds recently. Over the next 3 months patient would like to continue with 30 minutes of daily exercise. ? ?Health Coaching: Spoke with  patient about the daily recommendation for fruits and vegetables. Showed patient what a serving size would look like. Patient consumes whole grain cereals but not often. Gave patient suggestions for other whole grains that she can try adding into diet (whole wheat bread and pasta, oatmeal or brown rice). Patient has been walking daily for 30 minutes. Encouraged patient to continue with daily exercise routine. ? ?Navigation:  I will notify patient of lab results.  Patient is aware of 2 more health coaching sessions and a follow up. Took patient to the [employer] for shopping experience.  ? ?Time: 20 minutes ?

## 2021-05-20 LAB — GLUCOSE, RANDOM: Glucose: 94 mg/dL (ref 70–99)

## 2021-05-20 LAB — LIPID PANEL
Chol/HDL Ratio: 2.9 ratio (ref 0.0–4.4)
Cholesterol, Total: 168 mg/dL (ref 100–199)
HDL: 57 mg/dL (ref >39)
LDL Chol Calc (NIH): 100 mg/dL — ABNORMAL HIGH (ref 0–99)
Triglycerides: 56 mg/dL (ref 0–149)
VLDL Cholesterol Cal: 11 mg/dL (ref 5–40)

## 2021-05-20 LAB — HEMOGLOBIN A1C
Est. average glucose Bld gHb Est-mCnc: 114 mg/dL
Hgb A1c MFr Bld: 5.6 % (ref 4.8–5.6)

## 2021-05-26 ENCOUNTER — Telehealth: Payer: Self-pay

## 2021-05-26 NOTE — Telephone Encounter (Addendum)
Health coaching 2 ?  ?interpreter- Rudene Anda, [place] ?  ?Labs- 168 cholesterol, 100 LDL cholesterol, 56 triglycerides, 57 HDL cholesterol, 5.6 hemoglobin A1C, 94 mean plasma glucose.  Patient understands and is aware of her lab results. ?  ?Goals-  1. Reduce the amount of fried and fatty foods consumed. Try to grill, bake, broil or sautee foods instead. ?2. Watch the amount of sweets and sugars consumed in both foods and drinks. As well as carbs. ?3. Try and get 20-30 minutes of exercise daily.  ?  ?Navigation:  Patient is aware of 1 more health coaching sessions and a follow up.  ? ?Time-  12 minutes ?

## 2021-06-19 ENCOUNTER — Ambulatory Visit: Payer: Self-pay | Admitting: *Deleted

## 2021-06-19 ENCOUNTER — Encounter (INDEPENDENT_AMBULATORY_CARE_PROVIDER_SITE_OTHER): Payer: Self-pay

## 2021-06-19 ENCOUNTER — Ambulatory Visit
Admission: RE | Admit: 2021-06-19 | Discharge: 2021-06-19 | Disposition: A | Discharge status: Home / Self Care | Payer: No Typology Code available for payment source | Source: Ambulatory Visit | Attending: Obstetrics and Gynecology | Admitting: Obstetrics and Gynecology

## 2021-06-19 VITALS — BP 128/80 | Wt 155.9 lb

## 2021-06-19 DIAGNOSIS — Z1211 Encounter for screening for malignant neoplasm of colon: Secondary | ICD-10-CM

## 2021-06-19 DIAGNOSIS — Z1239 Encounter for other screening for malignant neoplasm of breast: Secondary | ICD-10-CM

## 2021-06-19 DIAGNOSIS — Z1231 Encounter for screening mammogram for malignant neoplasm of breast: Secondary | ICD-10-CM

## 2021-06-19 NOTE — Progress Notes (Signed)
[name] is a 51 y.o. female who presents to [facility] clinic today with no complaints.  ?  ?Pap Smear: Pap smear not completed today. Last Pap smear was 03/14/2012 at the free cervical cancer screening at the [facility] and normal. Per patient no history of an abnormal Pap smear. Patient has a history of a hysterectomy 07/14/2012 due to fibroids and AUB. Patient doesn't need any further Pap smears due to her history of a hysterectomy for benign reasons per BCCCP and ASCCP guidelines. Last Pap smear result is available in Epic. ? ?Physical exam: ?Breasts ?Left breast slightly larger than right breast. No skin abnormalities bilateral breasts. No nipple retraction bilateral breasts. No nipple discharge bilateral breasts. No lymphadenopathy. No lumps palpated bilateral breasts. No complaints of pain or tenderness on exam.     ? ?MS DIGITAL SCREENING BILATERAL ? ?Result Date: 07/01/2016 ?CLINICAL DATA:  Screening. EXAM: DIGITAL SCREENING BILATERAL MAMMOGRAM WITH CAD COMPARISON:  Previous exam(s). ACR Breast Density Category c: The breast tissue is heterogeneously dense, which may obscure small masses. FINDINGS: There are no findings suspicious for malignancy. Images were processed with CAD. IMPRESSION: No mammographic evidence of malignancy. A result letter of this screening mammogram will be mailed directly to the patient. RECOMMENDATION: Screening mammogram in one year. (Code:SM-B-01Y) BI-RADS CATEGORY  1: Negative. Electronically Signed   By: Nolon Nations M.D.   On: 07/01/2016 07:38  ? ?MS DIGITAL SCREENING TOMO BILATERAL ? ?Result Date: 06/14/2020 ?CLINICAL DATA:  Screening. EXAM: DIGITAL SCREENING BILATERAL MAMMOGRAM WITH TOMOSYNTHESIS AND CAD TECHNIQUE: Bilateral screening digital craniocaudal and mediolateral oblique mammograms were obtained. Bilateral screening digital breast tomosynthesis was performed. The images were evaluated with computer-aided detection. COMPARISON:  Previous  exam(s). ACR Breast Density Category c: The breast tissue is heterogeneously dense, which may obscure small masses. FINDINGS: There are no findings suspicious for malignancy. The images were evaluated with computer-aided detection. IMPRESSION: No mammographic evidence of malignancy. A result letter of this screening mammogram will be mailed directly to the patient. RECOMMENDATION: Screening mammogram in one year. (Code:SM-B-01Y) BI-RADS CATEGORY  1: Negative. Electronically Signed   By: Lajean Manes M.D.   On: 06/14/2020 11:11  ? ?MS DIGITAL SCREENING TOMO BILATERAL ? ?Result Date: 03/13/2019 ?CLINICAL DATA:  Screening. EXAM: DIGITAL SCREENING BILATERAL MAMMOGRAM WITH TOMO AND CAD COMPARISON:  Previous exam(s). ACR Breast Density Category c: The breast tissue is heterogeneously dense, which may obscure small masses. FINDINGS: There are no findings suspicious for malignancy. Images were processed with CAD. IMPRESSION: No mammographic evidence of malignancy. A result letter of this screening mammogram will be mailed directly to the patient. RECOMMENDATION: Screening mammogram in one year. (Code:SM-B-01Y) BI-RADS CATEGORY  1: Negative. Electronically Signed   By: Abelardo Diesel M.D.   On: 03/13/2019 13:15   ?  ?Pelvic/Bimanual ?Pap is not indicated today per BCCCP guidelines.  ?  ?Smoking History: ?Patient has never smoked. ?  ?Patient Navigation: ?Patient education provided. Access to services provided for patient through [place] program. Spanish interpreter Lenard Lance from [facility] provided.   ? ?Colorectal Cancer Screening: ?Per patient has never had colonoscopy completed. FIT Test given to patient to complete. No complaints today.  ?  ?Breast and Cervical Cancer Risk Assessment: ?Patient has family history of a maternal cousin having breast cancer. Patient has no known genetic mutations or history of radiation treatment to the chest before age 33. Patient does not have history of cervical dysplasia, immunocompromised,  or DES exposure in-utero. ? ?Risk Assessment   ? ?  Risk Scores   ? ?   06/19/2021 06/13/2020  ? Last edited by: Demetrius Revel, LPN [place], Karlton Lemon, LPN  ? 5-year risk: 0.6 % 0.6 %  ? Lifetime risk: 5.6 % 5.7 %  ? ?  ?  ? ?  ? ? ?A: ?BCCCP exam without pap smear ?No complaints. ? ?P: ?Referred patient to the [facility] for a screening mammogram on the mobile unit. Appointment scheduled Thursday, Jun 19, 2021 at 1000. ? ?Loletta Parish, RN ?06/19/2021 9:29 AM   ?

## 2021-06-19 NOTE — Patient Instructions (Signed)
Explained breast self awareness with Jill Meza. Patient did not need a Pap smear today due to patient has a history of a hysterectomy for benign reasons. Let patient know that she doesn't need any further Pap smears due to her history of a hysterectomy for benign reasons. Referred patient to the [facility] for a screening mammogram on the mobile unit. Appointment scheduled Thursday, Jun 19, 2021 at 1000. Patient aware of appointment and will be there. Let [facility] know [facility] will follow up with her within the next couple weeks with results of her mammogram by letter or phone. Jill Meza verbalized understanding. ? ?[name], Arvil Chaco, RN ?9:29 AM ? ? ? ? ?

## 2021-06-23 ENCOUNTER — Other Ambulatory Visit: Payer: Self-pay | Admitting: Obstetrics and Gynecology

## 2021-06-23 DIAGNOSIS — R928 Other abnormal and inconclusive findings on diagnostic imaging of breast: Secondary | ICD-10-CM

## 2021-07-29 ENCOUNTER — Ambulatory Visit
Admission: RE | Admit: 2021-07-29 | Discharge: 2021-07-29 | Disposition: A | Discharge status: Home / Self Care | Payer: No Typology Code available for payment source | Source: Ambulatory Visit | Attending: Obstetrics and Gynecology | Admitting: Obstetrics and Gynecology

## 2021-07-29 ENCOUNTER — Other Ambulatory Visit: Payer: Self-pay | Admitting: Obstetrics and Gynecology

## 2021-07-29 ENCOUNTER — Ambulatory Visit: Payer: No Typology Code available for payment source

## 2021-07-29 DIAGNOSIS — R928 Other abnormal and inconclusive findings on diagnostic imaging of breast: Secondary | ICD-10-CM

## 2021-07-29 DIAGNOSIS — R921 Mammographic calcification found on diagnostic imaging of breast: Secondary | ICD-10-CM

## 2021-08-19 LAB — FECAL OCCULT BLOOD, IMMUNOCHEMICAL: Fecal Occult Bld: NEGATIVE

## 2021-08-19 LAB — SPECIMEN STATUS REPORT

## 2021-08-27 ENCOUNTER — Telehealth: Payer: Self-pay

## 2021-08-27 NOTE — Telephone Encounter (Signed)
Via, Jill Meza, Spanish Interpreter [facility]), Patient informed negative FIT Test results. Patient verbalized understanding.

## 2021-10-29 ENCOUNTER — Telehealth: Payer: Self-pay

## 2021-10-29 NOTE — Telephone Encounter (Signed)
Health Coaching 3  interpreter-  Rudene Anda, [facility]   Goals- Patient has been eating more fruits and vegetables. Patient states that on most days she consumes 2 servings of fruits and vegetables. Patient has also started making smoothies with fruits and vegetables. Patient states that she has also been trying to consume more water. Patient has been exercising at home recently.   New goal- Maintenance  Barrier to reaching goal-    Strategies to overcome-    Navigation:  Patient is aware of  a follow up session. Patient is scheduled for FU on Monday, October 23 @ 10:15 am.   Time-  10 minutes

## 2021-12-01 ENCOUNTER — Inpatient Hospital Stay: Payer: Self-pay | Attending: Obstetrics and Gynecology | Admitting: *Deleted

## 2021-12-01 VITALS — BP 102/72 | Ht 63.0 in | Wt 151.3 lb

## 2021-12-01 DIAGNOSIS — Z0000 Encounter for general adult medical examination without abnormal findings: Secondary | ICD-10-CM

## 2021-12-01 NOTE — Progress Notes (Signed)
Wisewoman follow up   Interpreter: Rudene Anda, [place]   Clinical Measurement:   Vitals:   12/01/21 1030  BP: 112/78      Medical History:  Patient states that she does not have high cholesterol, does not have high blood pressure and she does not have diabetes.  Medications:  Patient states that she does not take medication to lower cholesterol, blood pressure and blood sugar.  Patient does not take an aspirin a day to help prevent a heart attack or stroke.    Blood pressure, self measurement: Patient states that she does not measure blood pressure from home. She checks her blood pressure N/A. She shares her readings with a health care provider: N/A.   Nutrition: Patient states that on average she eats 2 cups of fruit and 1 cups of vegetables per day. Patient states that she does not eat fish at least 2 times per week. Patient eats less than half servings of whole grains. Patient drinks less than 36 ounces of beverages with added sugar weekly: yes. Patient is currently watching sodium or salt intake: yes. In the past 7 days patient has had 0 drinks containing alcohol. On average patient drinks 0 drinks containing alcohol per day.      Physical activity:  Patient states that she gets 75 minutes of moderate and 75 minutes of vigorous physical activity each week.  Smoking status:  Patient states that she has has never smoked .   Quality of life:  Over the past 2 weeks patient states that she had little interest or pleasure in doing things: not at all. She has been feeling down, depressed or hopeless:not at all.    Risk reduction and counseling:   Health Coaching: Spoke with the patient about about continuing to try and practice a heart healthy diet (Watching the amount of fried and fatty foods consumed, watching the amount of red meat consumed, substituting low-fat or reduced fat dairy options, increase whole grain intake and increasing heart healthy fish intake. Patient has a good exercise  routine in place. Patient has been exercising 5 days a week and does a combination of high-intensity and low-intensity workouts.   Navigation: This was the  follow up session for this patient, I will check up on her progress in the coming months.  Time: 20 minutes

## 2022-02-11 ENCOUNTER — Other Ambulatory Visit: Payer: Self-pay | Admitting: Obstetrics and Gynecology

## 2022-02-11 ENCOUNTER — Ambulatory Visit
Admission: RE | Admit: 2022-02-11 | Discharge: 2022-02-11 | Disposition: A | Discharge status: Home / Self Care | Payer: No Typology Code available for payment source | Source: Ambulatory Visit | Attending: Obstetrics and Gynecology | Admitting: Obstetrics and Gynecology

## 2022-02-11 DIAGNOSIS — R921 Mammographic calcification found on diagnostic imaging of breast: Secondary | ICD-10-CM

## 2022-08-12 ENCOUNTER — Ambulatory Visit
Admission: RE | Admit: 2022-08-12 | Discharge: 2022-08-12 | Disposition: A | Discharge status: Home / Self Care | Payer: No Typology Code available for payment source | Source: Ambulatory Visit | Attending: Obstetrics and Gynecology | Admitting: Obstetrics and Gynecology

## 2022-08-12 DIAGNOSIS — R921 Mammographic calcification found on diagnostic imaging of breast: Secondary | ICD-10-CM

## 2022-12-07 ENCOUNTER — Telehealth: Payer: Self-pay

## 2022-12-07 NOTE — Telephone Encounter (Signed)
Telephoned patient at mobile number using language line interpreter#[phone]. Left a voice message with BCCCP contact information.

## 2022-12-17 ENCOUNTER — Other Ambulatory Visit: Payer: Self-pay

## 2022-12-17 DIAGNOSIS — N6310 Unspecified lump in the right breast, unspecified quadrant: Secondary | ICD-10-CM

## 2023-02-21 NOTE — Progress Notes (Signed)
 Wisewoman initial screening   Interpreter- Bernice Angry, [place]   Clinical Measurement:  Vitals:   02/22/23 0842 02/22/23 0906  BP: 123/77 116/85   Fasting Labs Drawn Today, will review with patient when they result.   Medical History: Patient states that she does not have high cholesterol, does not have high blood pressure and she does not have diabetes. Patient states that she does not have history of gestational hypertension, does not have history of pre-eclampsia/eclampsia and she does not have history of gestational diabetes.    Medications: Patient states that she does not take medication to lower cholesterol, blood pressure or blood sugar.  Patient does not take an aspirin a day to help prevent a heart attack or stroke.   Blood pressure, self measurement: Patient states that she does measure blood pressure from home. She checks her blood pressure monthly. She shares her readings with a health care provider: no.   Nutrition: Patient states that on average she eats 2 cups of fruit and 1 cups of vegetables per day. Patient states that she does not eat fish at least 2 times per week. Patient eats less than half servings of whole grains. Patient drinks less than 36 ounces of beverages with added sugar weekly: yes. Patient is currently watching sodium or salt intake: yes. In the past 7 days patient has consumed drinks containing alcohol on 0 days. On a day that patient consumes drinks containing alcohol on average 0 drinks are consumed.      Physical activity: Patient states that she gets 60 minutes of moderate and 60 minutes of vigorous physical activity each week.  Smoking status: Patient states that she has has never smoked .   Quality of life: Over the past 2 weeks patient states that she had little interest or pleasure in doing things: not at all. She has been feeling down, depressed or hopeless:not at all.   Social Determinants of Health Assessment:   Computer Use: During the last  12 months patient states that she has used any of the following: desktop/laptop, smart phone or tablet/other portable wireless computer: yes.   Internet Use: During the last 12 months, did you or any member of your household have access to the internet: Yes, by paying a cell phone company or internet service provider.   Food Insecurities: During the last 12 months, where there any times when you were worried that you would run out of food because of a lack of money or other resources: No.   Transportation Barriers: During the last 12 months, have you missed a doctor's appointment because of transportation problems: No.   Childcare Barriers: If you are currently using childcare services, please identify  the type of services you use. (If not using childcare services, please select Not applicable): not applicable. During the last 12 months, have you had any barriers to childcare services such as: not applicable.   Housing: What is your housing situation today: I have housing.   Intimate Partner Violence: During the last 12 months, how often did your partner physically hurt you: never. During the last 12 months, how often did your partner insult you or talk down to you: never.  Medication Adherence: During the last 12 months, did you ever forget to take your medicine: not applicable. During the last 12 months, were you careless ar times about taking your medicine: not applicable. During the last 12 months, when you felt better did you sometimes stop taking your medication: not applicable. During the last  12 months, sometimes if you felt worse when you took your medicine did you stop taking it: not applicable.   Risk reduction and counseling:   Health Coaching: Spoke with patient about the daily recommendations for fruits and vegetables. Showed patient what a serving size would look like. Patient consumes fish 1-2 times per month. Patient likes salmon. Encouraged patient to try and add a serving of  heart healthy fish (salmon) into her weekly diet. Patient consumes whole grain bread regularly but no other whole grains. Gave recommendations for other whole grains she can try incorporating into her daily diet (whole grain cereals, whole wheat or whole grain pasta, brown rice, oatmeal or whole grain or whole wheat crackers). Patient consumes sweet tea on occasion but not regularly. She states that she primarily drinks water . Patient watches her salt intake while cooking and eating. Patient tries to exercise at least four days a week for at least 30 minutes. Patient does a variety of exercising including: walking or jogging, zumba and step or dance classes.  Goal: Patient would like to consume 2 servings of fruit and 3 servings of vegetables daily. Patient will use portion container to measure out servings. Patient will reach this goal over the next month.   Navigation:  I will notify patient of lab results.  Patient is aware of 2 more health coaching sessions and a follow up.  Time: 25 minutes

## 2023-02-22 ENCOUNTER — Other Ambulatory Visit: Payer: Self-pay

## 2023-02-22 ENCOUNTER — Inpatient Hospital Stay: Payer: No Typology Code available for payment source | Attending: Obstetrics and Gynecology | Admitting: *Deleted

## 2023-02-22 VITALS — BP 116/85 | Ht 63.0 in | Wt 146.2 lb

## 2023-02-22 DIAGNOSIS — Z0000 Encounter for general adult medical examination without abnormal findings: Secondary | ICD-10-CM

## 2023-02-23 LAB — GLUCOSE, RANDOM: Glucose: 90 mg/dL (ref 70–99)

## 2023-02-23 LAB — LIPID PANEL
Chol/HDL Ratio: 2.7 {ratio} (ref 0.0–4.4)
Cholesterol, Total: 159 mg/dL (ref 100–199)
HDL: 58 mg/dL (ref >39)
LDL Chol Calc (NIH): 89 mg/dL (ref 0–99)
Triglycerides: 59 mg/dL (ref 0–149)
VLDL Cholesterol Cal: 12 mg/dL (ref 5–40)

## 2023-02-23 LAB — HEMOGLOBIN A1C
Est. average glucose Bld gHb Est-mCnc: 105 mg/dL
Hgb A1c MFr Bld: 5.3 % (ref 4.8–5.6)

## 2023-03-01 ENCOUNTER — Telehealth: Payer: Self-pay

## 2023-03-01 NOTE — Telephone Encounter (Signed)
Health coaching 2   interpreter- Natale Lay, [employer]   Labs-159 cholesterol, 89 LDL cholesterol, 59 triglycerides, 58 HDL cholesterol, 5.3 hemoglobin A1C, 90 mean plasma glucose. Patient understands and is aware of her lab results.   Goals- Patient would like to consume 2 servings of fruit and 3 servings of vegetables daily. Patient will use portion container to measure out servings. Patient will reach this goal over the next month.    Navigation:  Patient is aware of 1 more health coaching sessions and a follow up.   Time- 10 minutes

## 2023-03-04 ENCOUNTER — Ambulatory Visit: Admission: RE | Admit: 2023-03-04 | Payer: No Typology Code available for payment source | Source: Ambulatory Visit

## 2023-03-04 ENCOUNTER — Ambulatory Visit: Payer: No Typology Code available for payment source | Admitting: *Deleted

## 2023-03-04 ENCOUNTER — Ambulatory Visit
Admission: RE | Admit: 2023-03-04 | Discharge: 2023-03-04 | Disposition: A | Discharge status: Home / Self Care | Payer: No Typology Code available for payment source | Source: Ambulatory Visit | Attending: Obstetrics and Gynecology | Admitting: Obstetrics and Gynecology

## 2023-03-04 VITALS — BP 123/65 | Wt 148.0 lb

## 2023-03-04 DIAGNOSIS — N6310 Unspecified lump in the right breast, unspecified quadrant: Secondary | ICD-10-CM

## 2023-03-04 DIAGNOSIS — Z1239 Encounter for other screening for malignant neoplasm of breast: Secondary | ICD-10-CM

## 2023-03-04 DIAGNOSIS — Z1211 Encounter for screening for malignant neoplasm of colon: Secondary | ICD-10-CM

## 2023-03-04 NOTE — Progress Notes (Signed)
Jill Meza is a 53 y.o. female who presents to [facility] clinic today with no complaints. Patient referred to BCCCP due to having a right diagnostic mammogram completed 02/11/2022 that a bilateral diagnostic mammogram is recommended in one year.    Pap Smear: Pap smear not completed today. Last Pap smear was 03/14/2012 at the free cervical cancer screening at the [facility] and normal. Per patient no history of an abnormal Pap smear. Patient has a history of a hysterectomy 07/14/2012 due to fibroids and AUB. Patient doesn't need any further Pap smears due to her history of a hysterectomy for benign reasons per BCCCP and ASCCP guidelines. Last Pap smear result is available in Epic.   Physical exam: Breasts Left breast slightly larger than right breast. No skin abnormalities bilateral breasts. No nipple retraction bilateral breasts. No nipple discharge bilateral breasts. No lymphadenopathy. No lumps palpated bilateral breasts. No complaints of pain or tenderness on exam.        MM DIAG BREAST TOMO UNI RIGHT Result Date: 02/11/2022 CLINICAL DATA:  53 year old female for six-month follow-up of RIGHT breast calcifications. EXAM: DIGITAL DIAGNOSTIC UNILATERAL RIGHT MAMMOGRAM WITH TOMOSYNTHESIS TECHNIQUE: Right digital diagnostic mammography and breast tomosynthesis was performed. COMPARISON:  Previous exam(s). ACR Breast Density Category c: The breast tissue is heterogeneously dense, which may obscure small masses. FINDINGS: Full field and magnification views of the RIGHT breast again demonstrate 4 groups of calcifications within the UPPER OUTER and RETROAREOLAR RIGHT breast. Calcifications appear unchanged except for a few new small round calcifications within the posterior UPPER OUTER group. No suspicious forms are noted. IMPRESSION: No significant change in multiple groups of RIGHT breast calcifications. Six-month follow-up recommended to ensure 1 year stability. RECOMMENDATION: Bilateral  diagnostic mammogram with magnification views of the RIGHT breast in 6 months. I have discussed the findings and recommendations with the patient. If applicable, a reminder letter will be sent to the patient regarding the next appointment. BI-RADS CATEGORY  3: Probably benign. Electronically Signed   By: Harmon Pier M.D.   On: 02/11/2022 14:18  MS DIGITAL DIAG TOMO BILAT Result Date: 07/29/2021 CLINICAL DATA:  53 year old female recalled from screening mammogram dated 06/19/2021 for 3 groups of right breast calcifications in the left breast mass and asymmetry. EXAM: DIGITAL DIAGNOSTIC BILATERAL MAMMOGRAM WITH TOMOSYNTHESIS AND CAD; ULTRASOUND LEFT BREAST LIMITED TECHNIQUE: Bilateral digital diagnostic mammography and breast tomosynthesis was performed. The images were evaluated with computer-aided detection.; Targeted ultrasound examination of the left breast was performed. COMPARISON:  Previous exam(s). ACR Breast Density Category c: The breast tissue is heterogeneously dense, which may obscure small masses. FINDINGS: Three groups of calcifications in the central lateral right breast demonstrated amorphous morphology on the CC projection with suggestion of layering on the ML projection. There is a persistent oval, circumscribed equal density mass in the upper left breast at mid depth. An asymmetry in the medial left breast on the CC projection effaces into fibroglandular tissue on additional views. Precautionary ultrasound was performed. Targeted ultrasound is performed, showing an oval, circumscribed anechoic cysts at the 12 o'clock position 4 cm from the nipple on the left. It measures 1.4 x 0.8 x 1.4 cm. This correlates well with the mammographic finding. Evaluation of the entire medial left breast demonstrates no sonographic correlate to the questioned asymmetry. IMPRESSION: 1. Three probably benign groups of right breast calcifications. Recommendation is for short-term imaging follow-up. 2. Benign left  breast cyst. Additional asymmetry in the medial left breast effaces on today's views. No  further follow-up required. RECOMMENDATION: Diagnostic right breast mammogram in 6 months. I have discussed the findings and recommendations with the patient. If applicable, a reminder letter will be sent to the patient regarding the next appointment. BI-RADS CATEGORY  3: Probably benign. Electronically Signed   By: Sande Brothers M.D.   On: 07/29/2021 12:00  MS DIGITAL SCREENING TOMO BILATERAL Result Date: 06/20/2021 CLINICAL DATA:  Screening. EXAM: DIGITAL SCREENING BILATERAL MAMMOGRAM WITH TOMOSYNTHESIS AND CAD TECHNIQUE: Bilateral screening digital craniocaudal and mediolateral oblique mammograms were obtained. Bilateral screening digital breast tomosynthesis was performed. The images were evaluated with computer-aided detection. COMPARISON:  Previous exam(s). ACR Breast Density Category c: The breast tissue is heterogeneously dense, which may obscure small masses. FINDINGS: In the right breast 3 groups of calcifications requires further evaluation. In the left breast a mass and an asymmetry require further evaluation. IMPRESSION: Further evaluation is suggested for possible calcifications in the right breast. Further evaluation is suggested for possible mass and asymmetry in the left breast. RECOMMENDATION: Diagnostic mammogram and possibly ultrasound of both breasts. (Code:FI-B-50M) The patient will be contacted regarding the findings, and additional imaging will be scheduled. BI-RADS CATEGORY  0: Incomplete. Need additional imaging evaluation and/or prior mammograms for comparison. Electronically Signed   By: Gerome Sam III M.D.   On: 06/20/2021 17:29   MS DIGITAL SCREENING TOMO BILATERAL Result Date: 06/14/2020 CLINICAL DATA:  Screening. EXAM: DIGITAL SCREENING BILATERAL MAMMOGRAM WITH TOMOSYNTHESIS AND CAD TECHNIQUE: Bilateral screening digital craniocaudal and mediolateral oblique mammograms were obtained.  Bilateral screening digital breast tomosynthesis was performed. The images were evaluated with computer-aided detection. COMPARISON:  Previous exam(s). ACR Breast Density Category c: The breast tissue is heterogeneously dense, which may obscure small masses. FINDINGS: There are no findings suspicious for malignancy. The images were evaluated with computer-aided detection. IMPRESSION: No mammographic evidence of malignancy. A result letter of this screening mammogram will be mailed directly to the patient. RECOMMENDATION: Screening mammogram in one year. (Code:SM-B-01Y) BI-RADS CATEGORY  1: Negative. Electronically Signed   By: Amie Portland M.D.   On: 06/14/2020 11:11   MS DIGITAL SCREENING TOMO BILATERAL Result Date: 03/13/2019 CLINICAL DATA:  Screening. EXAM: DIGITAL SCREENING BILATERAL MAMMOGRAM WITH TOMO AND CAD COMPARISON:  Previous exam(s). ACR Breast Density Category c: The breast tissue is heterogeneously dense, which may obscure small masses. FINDINGS: There are no findings suspicious for malignancy. Images were processed with CAD. IMPRESSION: No mammographic evidence of malignancy. A result letter of this screening mammogram will be mailed directly to the patient. RECOMMENDATION: Screening mammogram in one year. (Code:SM-B-01Y) BI-RADS CATEGORY  1: Negative. Electronically Signed   By: Sherian Rein M.D.   On: 03/13/2019 13:15    Pelvic/Bimanual Pap is not indicated today per BCCCP guidelines.   Smoking History: Patient has never smoked.   Patient Navigation: Patient education provided. Access to services provided for patient through [facility] program. Spanish interpreter Natale Lay from [facility] provided.   Colorectal Cancer Screening: Per patient has never had colonoscopy completed. FIT Test completed 08/18/2021 that was negative. FIT Test given to patient to complete. No complaints today.   Breast and Cervical Cancer Risk Assessment: Patient has family history of a maternal cousin having  breast cancer. Patient has no known genetic mutations or history of radiation treatment to the chest before age 40. Patient does not have history of cervical dysplasia, immunocompromised, or DES exposure in-utero.   Risk Scores as of Encounter on 03/04/2023     Dondra Spry  5-year 0.74%   Lifetime 6.26%            Last calculated by Caprice Red, CMA on 03/04/2023 at  8:52 AM        A: BCCCP exam without pap smear No complaints.  P: Referred patient to the [facility] for a diagnostic mammogram per recommendation. Appointment scheduled Thursday, March 04, 2023 at 1010.  Priscille Heidelberg, RN 03/04/2023 9:12 AM

## 2023-03-04 NOTE — Patient Instructions (Signed)
Explained breast self awareness with Jill Meza. Patient did not need a Pap smear today due to patient has had a history of a hysterectomy for benign reasons. Let her know that she doesn't need any further Pap smears due to her history of hysterectomy for benign reasons. Referred patient to the [facility] for a diagnostic mammogram per recommendation. Appointment scheduled Thursday, March 04, 2023 at 1010. Patient aware of appointment and will be there. Otto Herb Torres verbalized understanding.  Chalisa Kobler, Kathaleen Maser, RN 9:12 AM

## 2023-04-14 LAB — SPECIMEN STATUS REPORT

## 2023-04-14 LAB — FECAL OCCULT BLOOD, IMMUNOCHEMICAL: Fecal Occult Bld: NEGATIVE

## 2023-10-25 ENCOUNTER — Telehealth: Payer: Self-pay

## 2023-10-25 NOTE — Telephone Encounter (Signed)
Left message for patient about completing HC 3 for the [facility] program. Left name and number for patient to call back. 

## 2023-12-13 ENCOUNTER — Telehealth: Payer: Self-pay

## 2023-12-13 NOTE — Telephone Encounter (Signed)
 Health Coaching 3  interpreter- Bernice Angry, [employer]   Goals-  Patient has been trying to practice a heart healthy diet. Patient has been trying to get 20-30 minutes of daily exercise when possible.      Navigation:  Patient is aware of  a follow up session. Patient is scheduled for FU on 01/17/24 @ 10:00 am.

## 2024-01-15 IMAGING — MG DIGITAL DIAGNOSTIC BILAT W/ TOMO W/ CAD
8 of 12 series · 8 of 28 positions shown · non-contrast
Comparison: Previous exam(s).

CLINICAL DATA: 50-year-old female recalled from screening mammogram
dated 06/19/2021 for 3 groups of right breast calcifications in the
left breast mass and asymmetry.

EXAM:
DIGITAL DIAGNOSTIC BILATERAL MAMMOGRAM WITH TOMOSYNTHESIS AND CAD;
ULTRASOUND LEFT BREAST LIMITED
TECHNIQUE: Bilateral digital diagnostic mammography and breast tomosynthesis
was performed. The images were evaluated with computer-aided
detection.; Targeted ultrasound examination of the left breast was
performed.

[R ML (1 of 3)]
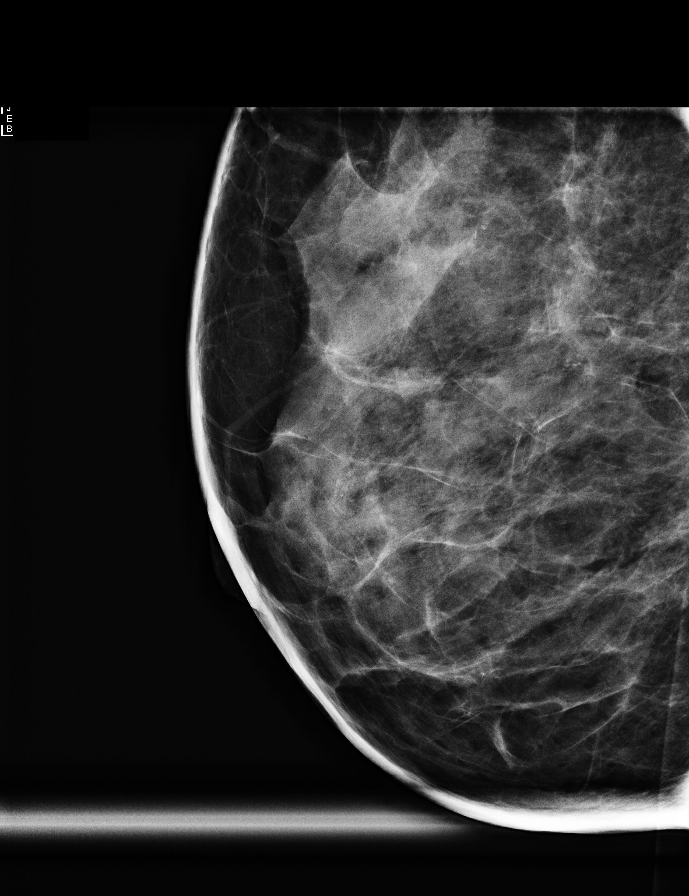

[R ML (2 of 3)]
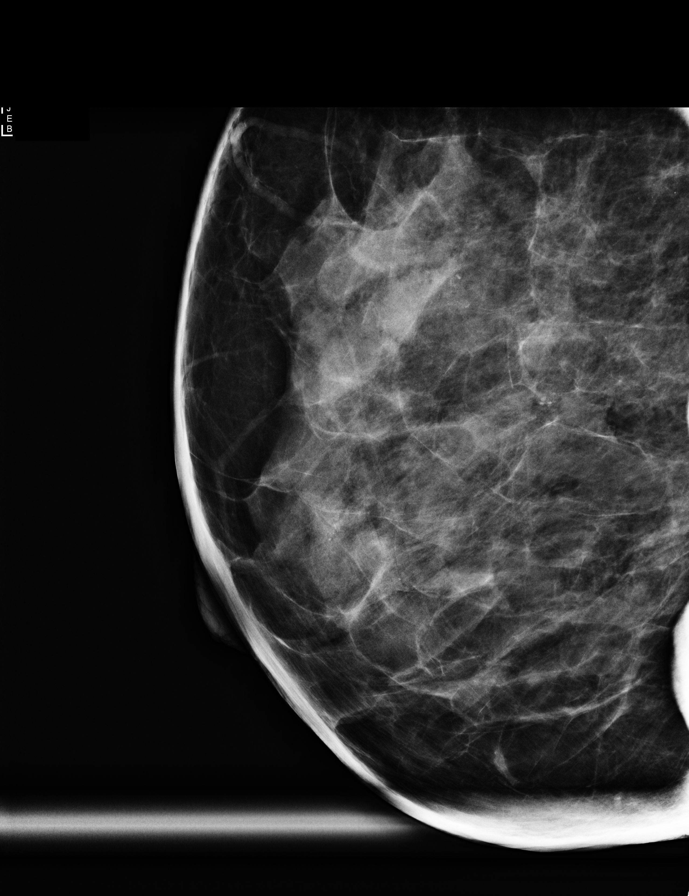

[R ML (3 of 3)]
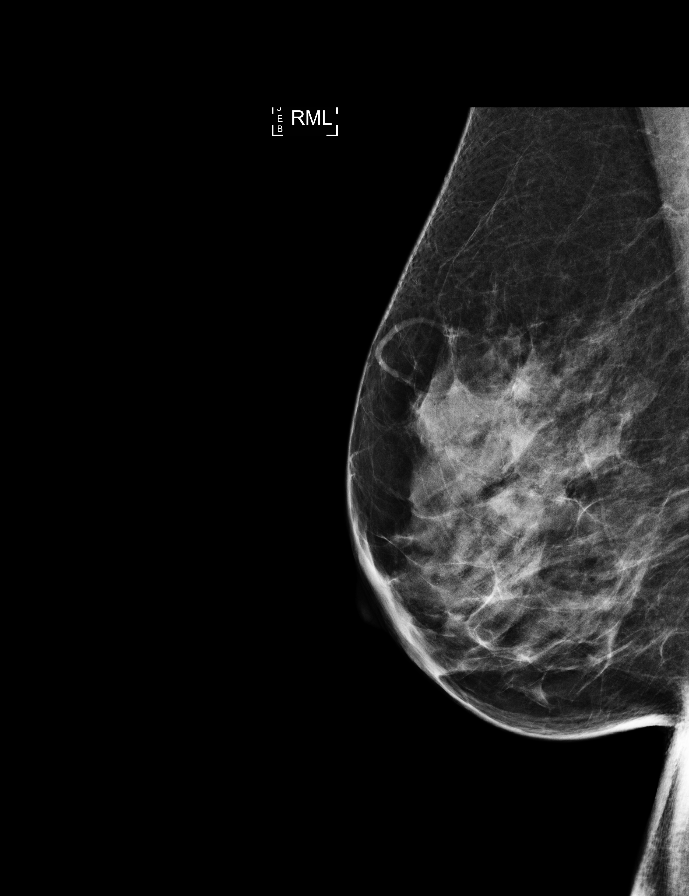

[R CC]
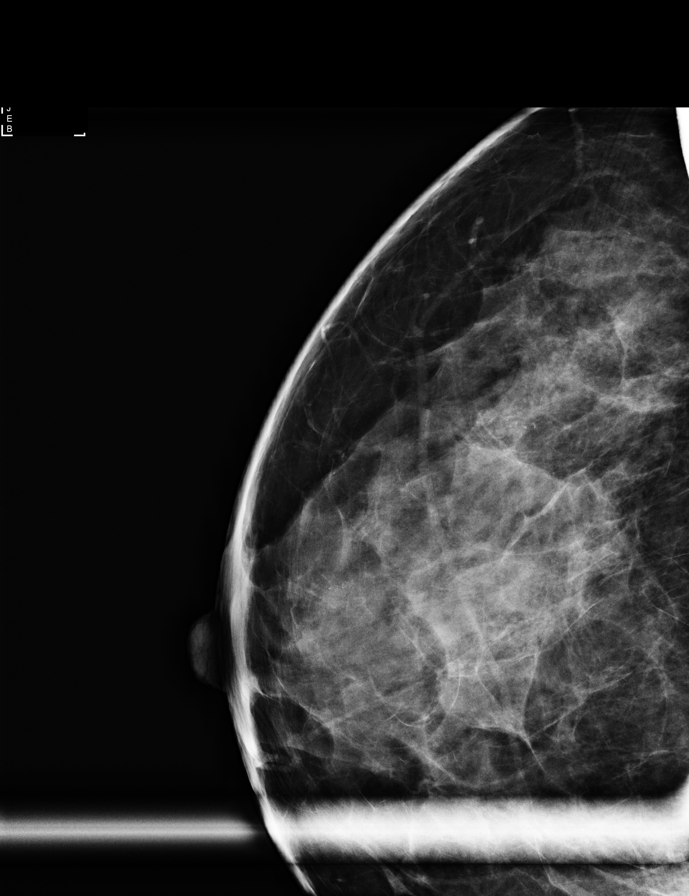

[L MLO synth-2D]
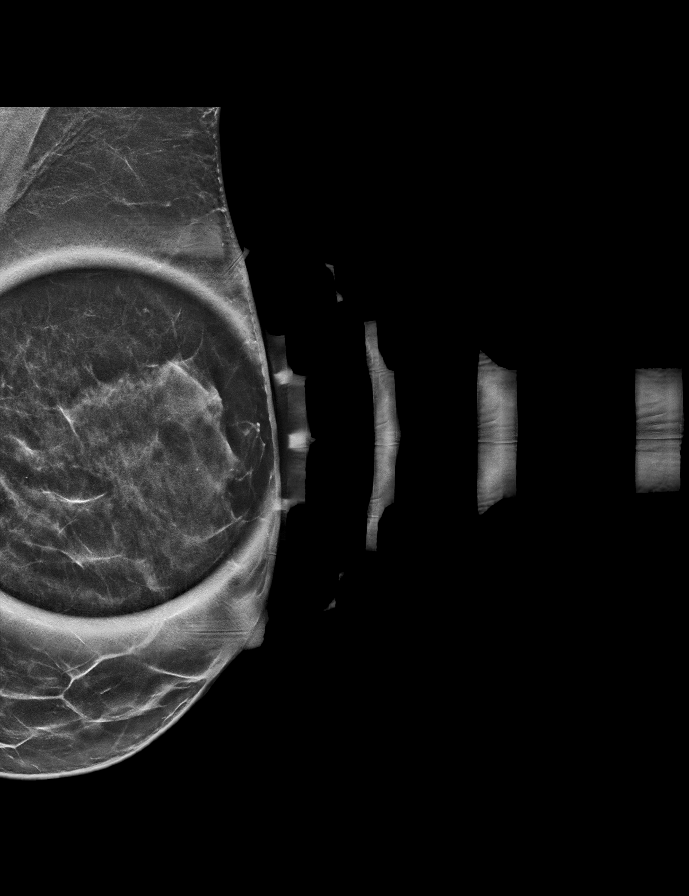

[L ML synth-2D]
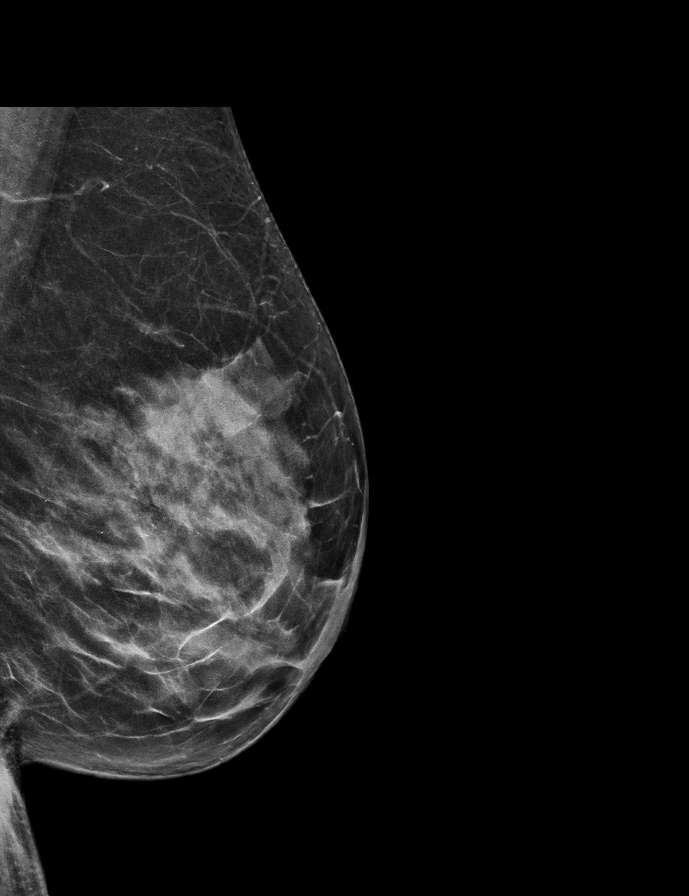

[L CC synth-2D]
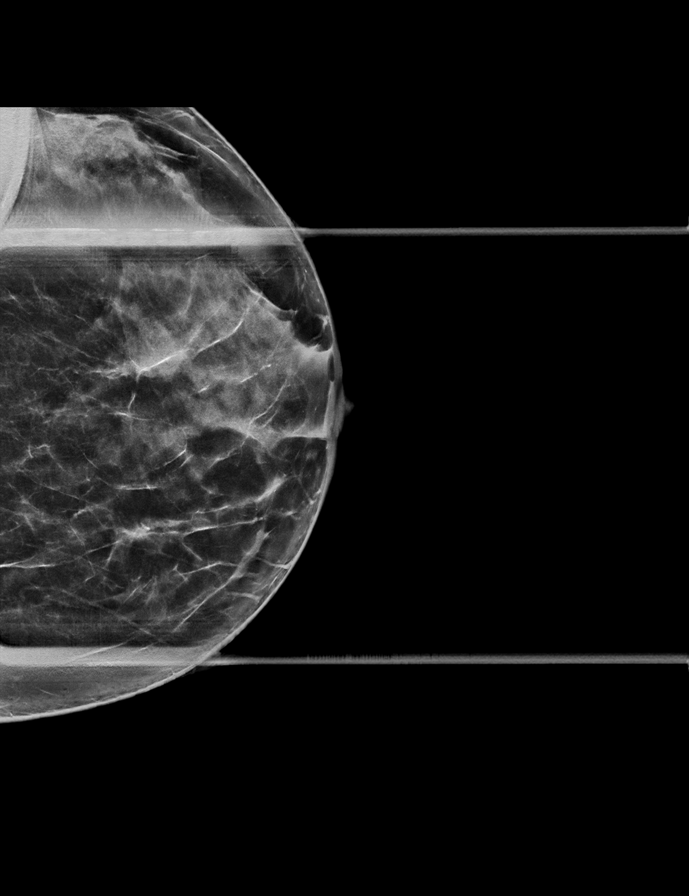

[L XCCM synth-2D]
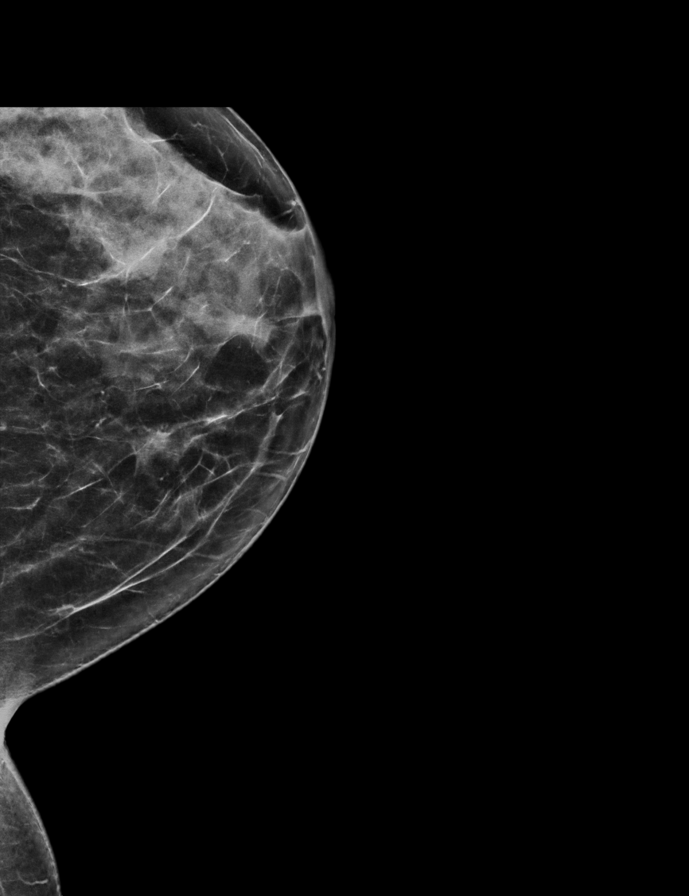

[8 of 28 positions shown; findings below may reference images not displayed]

ACR Breast Density Category c: The breast tissue is heterogeneously
dense, which may obscure small masses.
FINDINGS: Three groups of calcifications in the central lateral right breast
demonstrated amorphous morphology on the CC projection with
suggestion of layering on the ML projection.

There is a persistent oval, circumscribed equal density mass in the
upper left breast at mid depth. An asymmetry in the medial left
breast on the CC projection effaces into fibroglandular tissue on
additional views. Precautionary ultrasound was performed.

Targeted ultrasound is performed, showing an oval, circumscribed
anechoic cysts at the 12 o'clock position 4 cm from the nipple on
the left. It measures 1.4 x 0.8 x 1.4 cm. This correlates well with
the mammographic finding. Evaluation of the entire medial left
breast demonstrates no sonographic correlate to the questioned
asymmetry.
IMPRESSION: 1. Three probably benign groups of right breast calcifications.
Recommendation is for short-term imaging follow-up.
2. Benign left breast cyst. Additional asymmetry in the medial left
breast effaces on today's views. No further follow-up required.

RECOMMENDATION:
Diagnostic right breast mammogram in 6 months.

I have discussed the findings and recommendations with the patient.
If applicable, a reminder letter will be sent to the patient
regarding the next appointment.

BI-RADS CATEGORY  3: Probably benign.

## 2024-01-17 ENCOUNTER — Inpatient Hospital Stay

## 2024-01-19 ENCOUNTER — Other Ambulatory Visit: Payer: Self-pay

## 2024-01-19 ENCOUNTER — Inpatient Hospital Stay: Payer: Self-pay | Attending: Obstetrics and Gynecology | Admitting: *Deleted

## 2024-01-19 VITALS — BP 117/86 | Ht 63.0 in | Wt 149.0 lb

## 2024-01-19 DIAGNOSIS — R921 Mammographic calcification found on diagnostic imaging of breast: Secondary | ICD-10-CM

## 2024-01-19 DIAGNOSIS — Z0000 Encounter for general adult medical examination without abnormal findings: Secondary | ICD-10-CM

## 2024-01-19 NOTE — Progress Notes (Signed)
 [facility] follow up   Interpreter: Geni Angry  Clinical Measurement:   Vitals:   01/19/24 1104 01/19/24 1122  BP: 111/75 117/86      Medical History: Patient states that she does not have high cholesterol, does not have high blood pressure and she does not have diabetes. Patient states that she does not have history of gestational hypertension, does not have history of pre-eclampsia/eclampsia and she does not have history of gestational diabetes.     Medications: Patient states that she does not take medication to lower cholesterol, blood pressure and blood sugar.  Patient does not take an aspirin a day to help prevent a heart attack or stroke. During the past 7 days patient has taken no prescribed medication to lower her blood sugar, cholesterol, or blood pressure as she has not been prescribed any medication for any of these reasons.   Blood pressure, self measurement: Patient states that she does measure blood pressure from home. She checks her blood pressure weekly. She shares her readings with a health care provider: N/A.   Nutrition: Patient states that on average she eats 1 cup of fruit and 1 cup of vegetables per day. Patient states that she does not eat fish at least 2 times per week. Patient eats less than half servings of whole grains. Patient drinks less than 36 ounces of beverages with added sugar weekly: no. Patient is currently watching sodium or salt intake: yes. In the past 7 days patient has had 0 drinks containing alcohol. On average patient drinks 0 drinks containing alcohol per day.      Physical activity: Patient states that she gets 40-60 minutes of physical activity 6 days each week.  Smoking status: Patient states that she has has never smoked .   Quality of life: Over the past 2 weeks patient states that she had little interest or pleasure in doing things: not at all. She has been feeling down, depressed or hopeless:not at all.   Social Determinants of Health  Assessment:   Computer Use: During the last 12 months patient states that she has used any of the following: desktop/laptop, smart phone or tablet/other portable wireless computer: yes.   Internet Use: During the last 12 months, did you or any member of your household have access to the internet: Yes, by paying a cell phone company or internet service provider.   Food Insecurities: During the last 12 months, where there any times when you were worried that you would run out of food because of a lack of money or other resources: No.   Transportation Barriers: During the last 12 months, have you missed a doctor's appointment because of transportation problems: No.   Childcare Barriers: If you are currently using childcare services, please identify  the type of services you use. (If not using childcare services, please select Not applicable): not applicable. During the last 12 months, have you had any barriers to childcare services such as: not applicable.   Housing: What is your housing situation today: I have housing.   Intimate Partner Violence: During the last 12 months, how often did your partner physically hurt you: never. During the last 12 months, how often did your partner insult you or talk down to you: never.  Medication Adherence: During the last 12 months, did you ever forget to take your medicine: not applicable. During the last 12 months, were you careless ar times about taking your medicine: not applicable. During the last 12 months, when you felt better  did you sometimes stop taking your medication: not applicable. During the last 12 months, sometimes if you felt worse when you took your medicine did you stop taking it: not applicable.  Navigation: This was the  follow up session for this patient, I will check up on her progress in the coming months.

## 2024-02-29 NOTE — Progress Notes (Unsigned)
 [facility] Re-screening   Interpreter- Bernice Angry, [employer]   Clinical Measurement: There were no vitals filed for this visit. Fasting Labs Drawn Today, will review with patient when they result.   Medical History: Patient states that she {Blank single:19197::has,does not have,does not know if she has} high cholesterol, {Blank single:19197::has,does not have,does not know if she has} high blood pressure and she {Blank single:19197::has,does not have,does not know if she has} diabetes. Patient states that she {Blank single:19197::has,does not have,does not know if she has} history of gestational hypertension, {Blank single:19197::has,does not have,does not know if she has} history of pre-eclampsia/eclampsia and she {Blank single:19197::has,does not have,does not know if she has} history of gestational diabetes.    Medications: Patient states that she {Blank single:19197::does,does not} take medication to lower cholesterol, blood pressure or blood sugar.  Patient {Blank single:19197::does,does not} take an aspirin a day to help prevent a heart attack or stroke. During the past 7 days patient {Blank single:19197::has,has not,N/A} taken prescribed medication to lower {Blank single:19197::cholesterol,blood pressure,blood sugar} on *** days.   Blood pressure, self measurement: Patient states that she {Blank single:19197::does,does not,does not have the equipment to} measure blood pressure from home. She checks her blood pressure {Blank single:19197::multiple times per day,daily,a few times per week,weekly,monthly,N/A}. She shares her readings with a health care provider: {Blank single:19197::yes,no,N/A}.   Nutrition: Patient states that on average she eats *** cups of fruit and *** cups of vegetables per day. Patient states that she {Blank single:19197::does,does not} eat fish at least 2 times per week. Patient eats {Blank  single:19197::less than half,about half,more than half} servings of whole grains. Patient drinks less than 36 ounces of beverages with added sugar weekly: {Blank single:19197::yes,no}. Patient is currently watching sodium or salt intake: {Blank single:19197::yes,no}. In the past 7 days patient has consumed drinks containing alcohol on *** days. On a day that patient consumes drinks containing alcohol on average *** drinks are consumed.      Physical activity: Patient states that she gets *** minutes of physical activity each week.  Smoking status: Patient states that she has {Blank single:19197::has never smoked,is a former smoker, quit 1-12 months ago,is a former smoker, quit 12+ months ago,is a current smoker} .   Quality of life: Over the past 2 weeks patient states that she had little interest or pleasure in doing things: {Blank single:19197::not at all,several days,more than half,nearly everyday}. She has been feeling down, depressed or hopeless:{Blank single:19197::not at all,several days,more than half,nearly everyday}.   Social Determinants of Health Assessment:   Computer Use: During the last 12 months patient states that she has used any of the following: desktop/laptop, smart phone or tablet/other portable wireless computer: {Blank single:19197::yes,no,don't know}.   Internet Use: During the last 12 months, did you or any member of your household have access to the internet: {Blank single:19197::Yes, by paying a cell phone company or internet service provider,Yes, without paying a cell phone company or internet service provider,No access to internet in this house,apartment or mobile home}.   Food Insecurities: During the last 12 months, where there any times when you were worried that you would run out of food because of a lack of money or other resources: {Blank single:19197::Yes,No}.   Transportation Barriers: During the last 12  months, have you missed a doctor's appointment because of transportation problems: {Blank single:19197::Yes,No}.   Childcare Barriers: If you are currently using childcare services, please identify  the type of services you use. (If not using childcare services, please select Not applicable): {Blank  single:19197::infant (birth to 10 months,toddler (11 to 76 months),preschool (3 to 5 years),after school care (K-9th grade),not applicable}. During the last 12 months, have you had any barriers to childcare services such as: {Blank single:19197::cost,availability,location,transportation,hours of operation,other,not applicable}.   Housing: What is your housing situation today: {Blank single:19197::I have housing,I have housing, but I am worried about losing my housing,I do not have housing}.   Intimate Partner Violence: During the last 12 months, how often did your partner physically hurt you: {Blank single:19197::never,rarely,sometimes,fairly often,frequently,don't want to answer}. During the last 12 months, how often did your partner insult you or talk down to you: {Blank single:19197::never,rarely,sometimes,fairly often,frequently,don't want to answer}.  Medication Adherence: During the last 12 months, did you ever forget to take your medicine: {Blank single:19197::Yes,No,not applicable}. During the last 12 months, were you careless ar times about taking your medicine: {Blank single:19197::Yes,No,not applicable}. During the last 12 months, when you felt better did you sometimes stop taking your medication: {Blank single:19197::Yes,No,not applicable}. During the last 12 months, sometimes if you felt worse when you took your medicine did you stop taking it: {Blank single:19197::Yes,No,not applicable}.   Risk reduction and counseling:   Health Coaching:  Goal:   Navigation:  I will notify patient of lab results.  Patient  is aware of 2 more health coaching sessions and a follow up.

## 2024-03-01 ENCOUNTER — Other Ambulatory Visit: Payer: Self-pay

## 2024-03-01 ENCOUNTER — Inpatient Hospital Stay: Attending: Obstetrics and Gynecology

## 2024-03-01 VITALS — BP 119/68 | Ht 63.75 in | Wt 151.0 lb

## 2024-03-01 DIAGNOSIS — Z0000 Encounter for general adult medical examination without abnormal findings: Secondary | ICD-10-CM

## 2024-03-02 LAB — LIPID PANEL
Chol/HDL Ratio: 2.7 ratio (ref 0.0–4.4)
Cholesterol, Total: 175 mg/dL (ref 100–199)
HDL: 64 mg/dL
LDL Chol Calc (NIH): 101 mg/dL — ABNORMAL HIGH (ref 0–99)
Triglycerides: 48 mg/dL (ref 0–149)
VLDL Cholesterol Cal: 10 mg/dL (ref 5–40)

## 2024-03-02 LAB — HEMOGLOBIN A1C
Est. average glucose Bld gHb Est-mCnc: 114 mg/dL
Hgb A1c MFr Bld: 5.6 % (ref 4.8–5.6)

## 2024-03-02 LAB — GLUCOSE, RANDOM: Glucose: 94 mg/dL (ref 70–99)

## 2024-03-07 ENCOUNTER — Ambulatory Visit: Payer: Self-pay

## 2024-03-07 NOTE — Telephone Encounter (Signed)
 Left message for patient via [facility] [employer] [phone] about lab results from [facility]. Left name and number for patient to call back.

## 2024-03-09 ENCOUNTER — Ambulatory Visit: Payer: Self-pay | Admitting: *Deleted

## 2024-03-09 ENCOUNTER — Ambulatory Visit
Admission: RE | Admit: 2024-03-09 | Discharge: 2024-03-09 | Disposition: A | Discharge status: Home / Self Care | Payer: Self-pay | Source: Ambulatory Visit | Attending: Obstetrics and Gynecology | Admitting: Obstetrics and Gynecology

## 2024-03-09 VITALS — BP 118/67 | Wt 153.0 lb

## 2024-03-09 DIAGNOSIS — R921 Mammographic calcification found on diagnostic imaging of breast: Secondary | ICD-10-CM

## 2024-03-09 DIAGNOSIS — Z1239 Encounter for other screening for malignant neoplasm of breast: Secondary | ICD-10-CM

## 2024-03-09 NOTE — Patient Instructions (Addendum)
 Explained breast self awareness with Jill Meza. Patient did not need a Pap smear today due to patient has had a history of a hysterectomy for benign reasons. Let her know that she doesn't need any further Pap smears due to her history of hysterectomy for benign reasons. Referred patient to the [facility] for a diagnostic mammogram per recommendation. Appointment scheduled Thursday, March 10, 2023 at 0940. Patient aware of appointment and will be there. Jill Meza verbalized understanding.  Saturnino Liew, Wanda Ship, RN 8:45 AM

## 2024-03-09 NOTE — Progress Notes (Signed)
 Health coaching 2   interpreter- Bernice Angry, [employer]   Labs- 175 cholesterol, 101 LDL cholesterol, 64 HDL cholesterol, 48 triglycerides, 5.6 hemoglobin A1C, 94 mean plasma glucose. Patient understands and is aware of her lab results.   Goals-  Spoke with patient in detail about lab results. Spoke with patient about what a heart healthy diet is and how to practice it.  Decrease red meat intake. Increase lean protein intake like chicken or fish. Switch from whole fat milk to 2 percent.  Increase whole grain and fiber intake. Continue with regular exercise. Reduce sweetened beverage intake (sweet tea and agua de fruita).  Watch portion sizes with carb rich foods.  Gave patient educational brochures as well as a heart healthy cook book.   Navigation:  Patient is aware of 1 more health coaching sessions and a follow up.

## 2024-03-09 NOTE — Progress Notes (Signed)
 [name] is a 54 y.o. female who presents to [facility] clinic today with no complaints. Patient referred to [facility]CP due to having a bilateral diagnostic mammogram completed 03/04/2023 that was probably benign that a bilateral diagnostic mammogram is recommended in one year.    Pap Smear: Pap smear not completed today. Last Pap smear was 03/14/2012 at the free cervical cancer screening at the [facility] and normal. Per patient no history of an abnormal Pap smear. Patient has a history of a hysterectomy 07/14/2012 due to fibroids and AUB. Patient doesn't need any further Pap smears due to her history of a hysterectomy for benign reasons per BCCCP and ASCCP guidelines. Last Pap smear result is available in Epic.    Physical exam: Breasts Left breast slightly larger than right breast. No skin abnormalities bilateral breasts. No nipple retraction bilateral breasts. No nipple discharge bilateral breasts. No lymphadenopathy. No lumps palpated bilateral breasts. No complaints of pain or tenderness on exam.         MS 3D DIAG MAMMO BILAT BR (aka MM) Result Date: 03/04/2023 CLINICAL DATA:  1+ year interval follow-up of likely benign calcifications involving the UPPER OUTER QUADRANT of the RIGHT breast. Annual evaluation, LEFT breast. EXAM: DIGITAL DIAGNOSTIC BILATERAL MAMMOGRAM WITH TOMOSYNTHESIS AND CAD TECHNIQUE: Bilateral digital diagnostic mammography and breast tomosynthesis was performed. The images were evaluated with computer-aided detection. COMPARISON:  Previous exam(s). ACR Breast Density Category c: The breasts are heterogeneously dense, which may obscure small masses. FINDINGS: Full field CC and MLO views of both breasts and spot magnification CC and mediolateral views of the RIGHT breast calcifications were obtained. RIGHT: The scattered groups of faint round/punctate and amorphous calcifications in the UPPER OUTER QUADRANT are unchanged. Many of the calcifications demonstrate  layering on the mediolateral view. A group of predominantly round/punctate calcifications in the UPPER OUTER QUADRANT at posterior depth were initially visualized on the January, 2024 mammogram, and are unchanged. Again, a few of the calcifications demonstrate layering on the mediolateral view. There are no new suspicious linear or branching forms. No new or suspicious findings elsewhere. LEFT: No findings suspicious for malignancy. IMPRESSION: 1. Stable likely benign calcifications involving the UPPER OUTER QUADRANT of the RIGHT breast at posterior depth, identified on the January, 2024 mammogram. 2. Benign calcifications scattered elsewhere throughout the UPPER OUTER QUADRANT of the RIGHT breast. 3. No mammographic evidence of malignancy involving the LEFT breast. RECOMMENDATION: BILATERAL diagnostic mammography in 1 year to include spot magnification views of the calcifications in the UPPER OUTER QUADRANT of the RIGHT breast at posterior depth (in order to confirm 2 years of stability). I have discussed the findings and recommendations with the patient. Communication with the patient was achieved with the assistance of a certified interpreter. If applicable, a reminder letter will be sent to the patient regarding the next appointment. BI-RADS CATEGORY  3: Probably benign. Electronically Signed   By: Debby Satterfield M.D.   On: 03/04/2023 10:50   MM DIAG BREAST TOMO UNI RIGHT Result Date: 02/11/2022 CLINICAL DATA:  54 year old female for six-month follow-up of RIGHT breast calcifications. EXAM: DIGITAL DIAGNOSTIC UNILATERAL RIGHT MAMMOGRAM WITH TOMOSYNTHESIS TECHNIQUE: Right digital diagnostic mammography and breast tomosynthesis was performed. COMPARISON:  Previous exam(s). ACR Breast Density Category c: The breast tissue is heterogeneously dense, which may obscure small masses. FINDINGS: Full field and magnification views of the RIGHT breast again demonstrate 4 groups of calcifications within the UPPER OUTER  and RETROAREOLAR RIGHT breast. Calcifications appear unchanged except for a  few new small round calcifications within the posterior UPPER OUTER group. No suspicious forms are noted. IMPRESSION: No significant change in multiple groups of RIGHT breast calcifications. Six-month follow-up recommended to ensure 1 year stability. RECOMMENDATION: Bilateral diagnostic mammogram with magnification views of the RIGHT breast in 6 months. I have discussed the findings and recommendations with the patient. If applicable, a reminder letter will be sent to the patient regarding the next appointment. BI-RADS CATEGORY  3: Probably benign. Electronically Signed   By: Reyes Phi M.D.   On: 02/11/2022 14:18  MS DIGITAL DIAG TOMO BILAT Result Date: 07/29/2021 CLINICAL DATA:  54 year old female recalled from screening mammogram dated 06/19/2021 for 3 groups of right breast calcifications in the left breast mass and asymmetry. EXAM: DIGITAL DIAGNOSTIC BILATERAL MAMMOGRAM WITH TOMOSYNTHESIS AND CAD; ULTRASOUND LEFT BREAST LIMITED TECHNIQUE: Bilateral digital diagnostic mammography and breast tomosynthesis was performed. The images were evaluated with computer-aided detection.; Targeted ultrasound examination of the left breast was performed. COMPARISON:  Previous exam(s). ACR Breast Density Category c: The breast tissue is heterogeneously dense, which may obscure small masses. FINDINGS: Three groups of calcifications in the central lateral right breast demonstrated amorphous morphology on the CC projection with suggestion of layering on the ML projection. There is a persistent oval, circumscribed equal density mass in the upper left breast at mid depth. An asymmetry in the medial left breast on the CC projection effaces into fibroglandular tissue on additional views. Precautionary ultrasound was performed. Targeted ultrasound is performed, showing an oval, circumscribed anechoic cysts at the 12 o'clock position 4 cm from the nipple on  the left. It measures 1.4 x 0.8 x 1.4 cm. This correlates well with the mammographic finding. Evaluation of the entire medial left breast demonstrates no sonographic correlate to the questioned asymmetry. IMPRESSION: 1. Three probably benign groups of right breast calcifications. Recommendation is for short-term imaging follow-up. 2. Benign left breast cyst. Additional asymmetry in the medial left breast effaces on today's views. No further follow-up required. RECOMMENDATION: Diagnostic right breast mammogram in 6 months. I have discussed the findings and recommendations with the patient. If applicable, a reminder letter will be sent to the patient regarding the next appointment. BI-RADS CATEGORY  3: Probably benign. Electronically Signed   By: Serena  Chacko M.D.   On: 07/29/2021 12:00  MS DIGITAL SCREENING TOMO BILATERAL Result Date: 06/20/2021 CLINICAL DATA:  Screening. EXAM: DIGITAL SCREENING BILATERAL MAMMOGRAM WITH TOMOSYNTHESIS AND CAD TECHNIQUE: Bilateral screening digital craniocaudal and mediolateral oblique mammograms were obtained. Bilateral screening digital breast tomosynthesis was performed. The images were evaluated with computer-aided detection. COMPARISON:  Previous exam(s). ACR Breast Density Category c: The breast tissue is heterogeneously dense, which may obscure small masses. FINDINGS: In the right breast 3 groups of calcifications requires further evaluation. In the left breast a mass and an asymmetry require further evaluation. IMPRESSION: Further evaluation is suggested for possible calcifications in the right breast. Further evaluation is suggested for possible mass and asymmetry in the left breast. RECOMMENDATION: Diagnostic mammogram and possibly ultrasound of both breasts. (Code:[id number]-45M) The patient will be contacted regarding the findings, and additional imaging will be scheduled. BI-RADS CATEGORY  0: Incomplete. Need additional imaging evaluation and/or prior mammograms for  comparison. Electronically Signed   By: Alm Pouch III M.D.   On: 06/20/2021 17:29   MS DIGITAL SCREENING TOMO BILATERAL Result Date: 06/14/2020 CLINICAL DATA:  Screening. EXAM: DIGITAL SCREENING BILATERAL MAMMOGRAM WITH TOMOSYNTHESIS AND CAD TECHNIQUE: Bilateral screening digital craniocaudal and mediolateral oblique mammograms were obtained. Bilateral  screening digital breast tomosynthesis was performed. The images were evaluated with computer-aided detection. COMPARISON:  Previous exam(s). ACR Breast Density Category c: The breast tissue is heterogeneously dense, which may obscure small masses. FINDINGS: There are no findings suspicious for malignancy. The images were evaluated with computer-aided detection. IMPRESSION: No mammographic evidence of malignancy. A result letter of this screening mammogram will be mailed directly to the patient. RECOMMENDATION: Screening mammogram in one year. (Code:[id number]-01Y) BI-RADS CATEGORY  1: Negative. Electronically Signed   By: Alm Parkins M.D.   On: 06/14/2020 11:11    Pelvic/Bimanual Pap is not indicated today per BCCCP guidelines.   Smoking History: Patient has never smoked.   Patient Navigation: Patient education provided. Access to services provided for patient through [facility] program. Spanish interpreter Bernice Angry from [facility] provided.    Colorectal Cancer Screening: Per patient has never had colonoscopy completed. FIT Test completed 04/12/2023 that was negative. FIT Test given to patient to complete. No complaints today.    Breast and Cervical Cancer Risk Assessment: Patient has family history of a maternal cousin having breast cancer. Patient has no known genetic mutations or history of radiation treatment to the chest before age 14. Patient does not have history of cervical dysplasia, immunocompromised, or DES exposure in-utero.   Risk Scores as of Encounter on 03/09/2024     Alisa           5-year 0.76%   Lifetime 6.15%             Last calculated by Silas, Ansyi K, CMA on 03/09/2024 at  8:22 AM        A: BCCCP exam without pap smear No complaints.   P: Referred [facility] to [facility] for a diagnostic mammogram per recommendation. Appointment scheduled Thursday, March 10, 2023 at 0940.  Driscilla Wanda SQUIBB, RN 03/09/2024 8:45 AM
